# Patient Record
Sex: Male | Born: 1945 | Race: White | Hispanic: No | Marital: Married | Smoking: Never smoker
Health system: Southern US, Community
[De-identification: ages and names within clinical notes are randomized; demographics above are authoritative.]

## PROBLEM LIST (undated history)

## (undated) DIAGNOSIS — Z98811 Dental restoration status: Secondary | ICD-10-CM

## (undated) DIAGNOSIS — J45909 Unspecified asthma, uncomplicated: Secondary | ICD-10-CM

## (undated) DIAGNOSIS — K219 Gastro-esophageal reflux disease without esophagitis: Secondary | ICD-10-CM

## (undated) DIAGNOSIS — E7800 Pure hypercholesterolemia, unspecified: Secondary | ICD-10-CM

## (undated) DIAGNOSIS — H409 Unspecified glaucoma: Secondary | ICD-10-CM

## (undated) DIAGNOSIS — H269 Unspecified cataract: Secondary | ICD-10-CM

## (undated) DIAGNOSIS — J3081 Allergic rhinitis due to animal (cat) (dog) hair and dander: Secondary | ICD-10-CM

## (undated) DIAGNOSIS — M6530 Trigger finger, unspecified finger: Secondary | ICD-10-CM

## (undated) DIAGNOSIS — M109 Gout, unspecified: Secondary | ICD-10-CM

## (undated) DIAGNOSIS — E119 Type 2 diabetes mellitus without complications: Secondary | ICD-10-CM

## (undated) HISTORY — PX: GUM SURGERY: SHX658

## (undated) HISTORY — PX: RETINAL TEAR REPAIR CRYOTHERAPY: SHX5304

---

## 2012-08-10 DIAGNOSIS — M6530 Trigger finger, unspecified finger: Secondary | ICD-10-CM

## 2012-08-10 HISTORY — DX: Trigger finger, unspecified finger: M65.30

## 2012-09-01 ENCOUNTER — Other Ambulatory Visit: Payer: Self-pay | Admitting: Orthopedic Surgery

## 2012-09-01 ENCOUNTER — Encounter (HOSPITAL_BASED_OUTPATIENT_CLINIC_OR_DEPARTMENT_OTHER): Payer: Self-pay | Admitting: *Deleted

## 2012-09-01 NOTE — Pre-Procedure Instructions (Signed)
To come for BMET and EKG 

## 2012-09-05 ENCOUNTER — Encounter (HOSPITAL_BASED_OUTPATIENT_CLINIC_OR_DEPARTMENT_OTHER)
Admission: RE | Admit: 2012-09-05 | Discharge: 2012-09-05 | Disposition: A | Discharge status: Home / Self Care | Payer: Medicare Other | Source: Ambulatory Visit | Attending: Orthopedic Surgery | Admitting: Orthopedic Surgery

## 2012-09-05 ENCOUNTER — Other Ambulatory Visit: Payer: Self-pay

## 2012-09-05 LAB — BASIC METABOLIC PANEL
BUN: 23 mg/dL (ref 6–23)
Calcium: 9.4 mg/dL (ref 8.4–10.5)
Creatinine, Ser: 0.87 mg/dL (ref 0.50–1.35)
GFR calc Af Amer: >90 mL/min (ref >90)
GFR calc non Af Amer: 87 mL/min — ABNORMAL LOW (ref >90)
Glucose, Bld: 202 mg/dL — ABNORMAL HIGH (ref 70–99)

## 2012-09-06 ENCOUNTER — Ambulatory Visit (HOSPITAL_BASED_OUTPATIENT_CLINIC_OR_DEPARTMENT_OTHER): Payer: Medicare Other | Admitting: Anesthesiology

## 2012-09-06 ENCOUNTER — Ambulatory Visit (HOSPITAL_BASED_OUTPATIENT_CLINIC_OR_DEPARTMENT_OTHER)
Admission: RE | Admit: 2012-09-06 | Discharge: 2012-09-06 | Disposition: A | Discharge status: Home / Self Care | Payer: Medicare Other | Source: Ambulatory Visit | Attending: Orthopedic Surgery | Admitting: Orthopedic Surgery

## 2012-09-06 ENCOUNTER — Encounter (HOSPITAL_BASED_OUTPATIENT_CLINIC_OR_DEPARTMENT_OTHER)
Admission: RE | Disposition: A | Discharge status: Home / Self Care | Payer: Self-pay | Source: Ambulatory Visit | Attending: Orthopedic Surgery

## 2012-09-06 ENCOUNTER — Encounter (HOSPITAL_BASED_OUTPATIENT_CLINIC_OR_DEPARTMENT_OTHER): Payer: Self-pay | Admitting: Anesthesiology

## 2012-09-06 ENCOUNTER — Encounter (HOSPITAL_BASED_OUTPATIENT_CLINIC_OR_DEPARTMENT_OTHER): Payer: Self-pay | Admitting: Orthopedic Surgery

## 2012-09-06 DIAGNOSIS — M109 Gout, unspecified: Secondary | ICD-10-CM | POA: Insufficient documentation

## 2012-09-06 DIAGNOSIS — E119 Type 2 diabetes mellitus without complications: Secondary | ICD-10-CM | POA: Insufficient documentation

## 2012-09-06 DIAGNOSIS — G709 Myoneural disorder, unspecified: Secondary | ICD-10-CM | POA: Insufficient documentation

## 2012-09-06 DIAGNOSIS — E7800 Pure hypercholesterolemia, unspecified: Secondary | ICD-10-CM | POA: Insufficient documentation

## 2012-09-06 DIAGNOSIS — I6529 Occlusion and stenosis of unspecified carotid artery: Secondary | ICD-10-CM | POA: Insufficient documentation

## 2012-09-06 DIAGNOSIS — Z9109 Other allergy status, other than to drugs and biological substances: Secondary | ICD-10-CM | POA: Insufficient documentation

## 2012-09-06 DIAGNOSIS — M6530 Trigger finger, unspecified finger: Secondary | ICD-10-CM | POA: Insufficient documentation

## 2012-09-06 DIAGNOSIS — H409 Unspecified glaucoma: Secondary | ICD-10-CM | POA: Insufficient documentation

## 2012-09-06 DIAGNOSIS — H269 Unspecified cataract: Secondary | ICD-10-CM | POA: Insufficient documentation

## 2012-09-06 DIAGNOSIS — Z886 Allergy status to analgesic agent status: Secondary | ICD-10-CM | POA: Insufficient documentation

## 2012-09-06 DIAGNOSIS — J45909 Unspecified asthma, uncomplicated: Secondary | ICD-10-CM | POA: Insufficient documentation

## 2012-09-06 HISTORY — DX: Unspecified cataract: H26.9

## 2012-09-06 HISTORY — DX: Gout, unspecified: M10.9

## 2012-09-06 HISTORY — DX: Dental restoration status: Z98.811

## 2012-09-06 HISTORY — DX: Type 2 diabetes mellitus without complications: E11.9

## 2012-09-06 HISTORY — DX: Gastro-esophageal reflux disease without esophagitis: K21.9

## 2012-09-06 HISTORY — DX: Unspecified glaucoma: H40.9

## 2012-09-06 HISTORY — DX: Pure hypercholesterolemia, unspecified: E78.00

## 2012-09-06 HISTORY — DX: Trigger finger, unspecified finger: M65.30

## 2012-09-06 HISTORY — PX: TRIGGER FINGER RELEASE: SHX641

## 2012-09-06 HISTORY — DX: Unspecified asthma, uncomplicated: J45.909

## 2012-09-06 HISTORY — DX: Allergic rhinitis due to animal (cat) (dog) hair and dander: J30.81

## 2012-09-06 LAB — GLUCOSE, CAPILLARY: Glucose-Capillary: 124 mg/dL — ABNORMAL HIGH (ref 70–99)

## 2012-09-06 LAB — POCT HEMOGLOBIN-HEMACUE: Hemoglobin: 12 g/dL — ABNORMAL LOW (ref 13.0–17.0)

## 2012-09-06 SURGERY — RELEASE, A1 PULLEY, FOR TRIGGER FINGER
Anesthesia: Regional | Site: Hand | Laterality: Right | Wound class: Clean

## 2012-09-06 MED ORDER — HYDROCODONE-ACETAMINOPHEN 5-325 MG PO TABS: 1.0000 | ORAL_TABLET | Freq: Four times a day (QID) | ORAL | Status: DC | PRN

## 2012-09-06 MED ORDER — MIDAZOLAM HCL 2 MG/ML PO SYRP: 12.0000 mg | ORAL_SOLUTION | Freq: Once | ORAL | Status: DC | PRN

## 2012-09-06 MED ORDER — LIDOCAINE HCL (PF) 0.5 % IJ SOLN
INTRAMUSCULAR | Status: DC | PRN
  Administered 2012-09-06: 30 mL via INTRAVENOUS

## 2012-09-06 MED ORDER — FENTANYL CITRATE 0.05 MG/ML IJ SOLN: 50.0000 ug | INTRAMUSCULAR | Status: DC | PRN

## 2012-09-06 MED ORDER — MIDAZOLAM HCL 5 MG/5ML IJ SOLN
INTRAMUSCULAR | Status: DC | PRN
  Administered 2012-09-06: 1 mg via INTRAVENOUS

## 2012-09-06 MED ORDER — OXYCODONE HCL 5 MG PO TABS: 5.0000 mg | ORAL_TABLET | Freq: Once | ORAL | Status: DC | PRN

## 2012-09-06 MED ORDER — CHLORHEXIDINE GLUCONATE 4 % EX LIQD: 60.0000 mL | Freq: Once | CUTANEOUS | Status: DC

## 2012-09-06 MED ORDER — LACTATED RINGERS IV SOLN
INTRAVENOUS | Status: DC
  Administered 2012-09-06: 09:00:00 via INTRAVENOUS

## 2012-09-06 MED ORDER — OXYCODONE HCL 5 MG/5ML PO SOLN: 5.0000 mg | Freq: Once | ORAL | Status: DC | PRN

## 2012-09-06 MED ORDER — MIDAZOLAM HCL 2 MG/2ML IJ SOLN: 1.0000 mg | INTRAMUSCULAR | Status: DC | PRN

## 2012-09-06 MED ORDER — HYDROMORPHONE HCL PF 1 MG/ML IJ SOLN: 0.2500 mg | INTRAMUSCULAR | Status: DC | PRN

## 2012-09-06 MED ORDER — PROPOFOL INFUSION 10 MG/ML OPTIME
INTRAVENOUS | Status: DC | PRN
  Administered 2012-09-06: 75 ug/kg/min via INTRAVENOUS

## 2012-09-06 MED ORDER — ONDANSETRON HCL 4 MG/2ML IJ SOLN
INTRAMUSCULAR | Status: DC | PRN
  Administered 2012-09-06: 4 mg via INTRAVENOUS

## 2012-09-06 MED ORDER — CEFAZOLIN SODIUM-DEXTROSE 2-3 GM-% IV SOLR
2.0000 g | INTRAVENOUS | Status: AC
  Administered 2012-09-06: 2 g via INTRAVENOUS

## 2012-09-06 MED ORDER — BUPIVACAINE HCL (PF) 0.25 % IJ SOLN
INTRAMUSCULAR | Status: DC | PRN
  Administered 2012-09-06: 10 mL

## 2012-09-06 MED ORDER — FENTANYL CITRATE 0.05 MG/ML IJ SOLN
INTRAMUSCULAR | Status: DC | PRN
  Administered 2012-09-06: 50 ug via INTRAVENOUS

## 2012-09-06 MED ORDER — LIDOCAINE HCL (CARDIAC) 20 MG/ML IV SOLN
INTRAVENOUS | Status: DC | PRN
  Administered 2012-09-06: 30 mg via INTRAVENOUS

## 2012-09-06 SURGICAL SUPPLY — 33 items
BANDAGE COBAN STERILE 2 (GAUZE/BANDAGES/DRESSINGS) ×4 IMPLANT
BLADE SURG 15 STRL LF DISP TIS (BLADE) ×2 IMPLANT
BLADE SURG 15 STRL SS (BLADE) ×2
BNDG ESMARK 4X9 LF (GAUZE/BANDAGES/DRESSINGS) IMPLANT
CHLORAPREP W/TINT 26ML (MISCELLANEOUS) ×6 IMPLANT
CLOTH BEACON ORANGE TIMEOUT ST (SAFETY) ×2 IMPLANT
CORDS BIPOLAR (ELECTRODE) IMPLANT
COVER MAYO STAND STRL (DRAPES) ×6 IMPLANT
COVER TABLE BACK 60X90 (DRAPES) ×4 IMPLANT
CUFF TOURNIQUET SINGLE 18IN (TOURNIQUET CUFF) ×2 IMPLANT
DECANTER SPIKE VIAL GLASS SM (MISCELLANEOUS) IMPLANT
DRAPE EXTREMITY T 121X128X90 (DRAPE) ×4 IMPLANT
DRAPE SURG 17X23 STRL (DRAPES) ×2 IMPLANT
GAUZE XEROFORM 1X8 LF (GAUZE/BANDAGES/DRESSINGS) ×4 IMPLANT
GLOVE BIO SURGEON STRL SZ 6.5 (GLOVE) ×4 IMPLANT
GLOVE BIOGEL PI IND STRL 8.5 (GLOVE) ×2 IMPLANT
GLOVE BIOGEL PI INDICATOR 8.5 (GLOVE) ×2
GLOVE SURG ORTHO 8.0 STRL STRW (GLOVE) ×4 IMPLANT
GOWN BRE IMP PREV XXLGXLNG (GOWN DISPOSABLE) ×2 IMPLANT
GOWN PREVENTION PLUS XLARGE (GOWN DISPOSABLE) ×4 IMPLANT
NEEDLE 27GAX1X1/2 (NEEDLE) ×4 IMPLANT
NS IRRIG 1000ML POUR BTL (IV SOLUTION) ×2 IMPLANT
PACK BASIN DAY SURGERY FS (CUSTOM PROCEDURE TRAY) ×4 IMPLANT
PADDING CAST ABS 4INX4YD NS (CAST SUPPLIES) ×1
PADDING CAST ABS COTTON 4X4 ST (CAST SUPPLIES) ×1 IMPLANT
SHEET MEDIUM DRAPE 40X70 STRL (DRAPES) ×2 IMPLANT
SPONGE GAUZE 4X4 12PLY (GAUZE/BANDAGES/DRESSINGS) ×4 IMPLANT
STOCKINETTE 4X48 STRL (DRAPES) ×4 IMPLANT
SUT VICRYL RAPIDE 4/0 PS 2 (SUTURE) ×4 IMPLANT
SYR BULB 3OZ (MISCELLANEOUS) ×4 IMPLANT
SYR CONTROL 10ML LL (SYRINGE) ×4 IMPLANT
TOWEL OR 17X24 6PK STRL BLUE (TOWEL DISPOSABLE) ×4 IMPLANT
UNDERPAD 30X30 INCONTINENT (UNDERPADS AND DIAPERS) ×4 IMPLANT

## 2012-09-06 NOTE — H&P (Signed)
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 This has been going on for approximately 15 years. He is complaining of some stiffness. He states 15 years ago he had these injected by a rheumatologist while in [place]. This did not give him relief. He has subsequently been diagnosed with diabetes. He also has a history of gout. There is no history of thyroid problems, or arthritis. He has no history of injury to the hands or neck. He describes a mild to moderate aching type pain with weakness and stiffness.  He feels it is getting worse. Flexion causes increased symptoms for him. He has been using Voltaren gel without significant relief.He has had two injections. He continues to have triggering of his middle finger, ring and little on his right hand.    Past Medical History: He is allergic to aspirin. He is on Allopurinol, Glimipride, Simvastatin, Tramadol, and Lumigan. He has had a retinal repair.    Family Medical History: Positive for diabetes, heart disease, and high BP.  Social History: He does not smoke. He drinks socially. He is married and retired.  Review of Systems: Positive for glasses, asthma, otherwise negative for 14 points.  Brett Atkins is an 67 y.o. male.   Chief Complaint: STS RT middle, ring and small fingers HPI: see above  Past Medical History  Diagnosis Date  . GERD (gastroesophageal reflux disease)   . Gout   . Asthma     as a child - rare episodes now  . Allergy to cats   . Glaucoma   . Cataract, immature   . Dental crown present   . 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 Diabetes mellitus type 2, noninsulin dependent   . 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 Gum surgery    . Retinal tear repair cryotherapy Left     History  reviewed. No pertinent family history. Social History:  reports that he has never smoked. He has never used smokeless tobacco. He reports that  drinks alcohol. He reports that he does not use illicit drugs.  Allergies:  Allergies  Allergen Reactions  . Aspirin Swelling    SWELLING OF THROAT    No prescriptions prior to admission    Results for orders placed during the hospital encounter of 09/06/12 (from the past 48 hour(s))  BASIC METABOLIC PANEL     Status: Abnormal   Collection Time    09/05/12 12:15 PM      Result Value Range   Sodium 138  135 - 145 mEq/L   Potassium 4.4  3.5 - 5.1 mEq/L   Chloride 103  96 - 112 mEq/L   CO2 23  19 - 32 mEq/L   Glucose, Bld 202 (*) 70 - 99 mg/dL   BUN 23  6 - 23 mg/dL   Creatinine, Ser 1.61  0.50 - 1.35 mg/dL   Calcium 9.4  8.4 - 09.6 mg/dL   GFR calc non Af Amer 87 (*) >90 mL/min   GFR calc Af Amer >90  >90 mL/min   Comment:            The eGFR has been calculated     using the CKD EPI equation.     This calculation has not been  validated in all clinical     situations.     eGFR's persistently     <90 mL/min signify     possible Chronic Kidney Disease.    No results found.   Pertinent items are noted in HPI.  Height 5' 7.5" (1.715 m), weight 78.926 kg (174 lb).  General appearance: alert, cooperative and appears stated age Head: Normocephalic, without obvious abnormality Neck: no JVD Resp: clear to auscultation bilaterally Cardio: regular rate and rhythm, S1, S2 normal, no murmur, click, rub or gallop GI: soft, non-tender; bowel sounds normal; no masses,  no organomegaly Extremities: extremities normal, atraumatic, no cyanosis or edema Pulses: 2+ and symmetric Skin: Skin color, texture, turgor normal. No rashes or lesions Neurologic: Grossly normal Incision/Wound: na  Assessment/Plan He is desirous of proceeding to have this surgically released.  We have discussed release of the A-1 pulley right middle, ring and  little fingers.  This can be done as an outpatient under regional anesthesia.  He and his wife are aware there is no guarantee with the surgery, possibility of infection, recurrence, injury to arteries, nerves, tendons, incomplete relief of symptoms and dystrophy.     This will be scheduled as an outpatient under regional anesthesia for release A-1 pulley right middle, ring and small fingers.  Muneeb Veras R 09/06/2012, 7:42 AM

## 2012-09-06 NOTE — Anesthesia Postprocedure Evaluation (Signed)
  Anesthesia Post-op Note  Patient: Engineer, agricultural  Procedure(s) Performed: Procedure(s): RELEASE TRIGGER FINGER/A-1 PULLEY RIGHT MIDDLE, RING AND SMALL FINGER (Right)  Patient Location: PACU  Anesthesia Type:MAC and Bier block  Level of Consciousness: awake  Airway and Oxygen Therapy: Patient Spontanous Breathing  Post-op Pain: mild  Post-op Assessment: Post-op Vital signs reviewed, Patient's Cardiovascular Status Stable, Respiratory Function Stable, Patent Airway, No signs of Nausea or vomiting and Pain level controlled  Post-op Vital Signs: stable  Complications: No apparent anesthesia complications

## 2012-09-06 NOTE — Op Note (Signed)
Dictation Number [id number]

## 2012-09-06 NOTE — Transfer of Care (Signed)
Immediate Anesthesia Transfer of Care Note  Patient: Brett Atkins  Procedure(s) Performed: Procedure(s): RELEASE TRIGGER FINGER/A-1 PULLEY RIGHT MIDDLE, RING AND SMALL FINGER (Right)  Patient Location: PACU  Anesthesia Type:Bier block  Level of Consciousness: awake, alert , oriented and patient cooperative  Airway & Oxygen Therapy: Patient Spontanous Breathing and Patient connected to face mask oxygen  Post-op Assessment: Report given to PACU RN and Post -op Vital signs reviewed and stable  Post vital signs: Reviewed and stable  Complications: No apparent anesthesia complications

## 2012-09-06 NOTE — Anesthesia Procedure Notes (Addendum)
Procedure Name: MAC Date/Time: 09/06/2012 9:55 AM Performed by: Gyasi Hazzard D Pre-anesthesia Checklist: Patient identified, Emergency Drugs available, Suction available and Patient being monitored Patient Re-evaluated:Patient Re-evaluated prior to inductionOxygen Delivery Method: Simple face mask   Anesthesia Regional Block:  Bier block (IV Regional)  Pre-Anesthetic Checklist: ,, timeout performed, Correct Patient, Correct Site, Correct Laterality, Correct Procedure,, site marked, surgical consent,, at surgeon's request Needles:  Injection technique: Single-shot  Needle Type: Other      Needle Gauge: 20 and 20 G    Additional Needles: Bier block (IV Regional) Narrative:   Performed by: Personally   Bier block (IV Regional)

## 2012-09-06 NOTE — Brief Op Note (Signed)
09/06/2012  10:36 AM  PATIENT:  Brett Atkins  67 y.o. male  PRE-OPERATIVE DIAGNOSIS:  STS RIGHT MIDDLE, RING AND SMALL FINGER  POST-OPERATIVE DIAGNOSIS:  Stenosing Tenosynovitis Right Middle, Ring, and Small Finger  PROCEDURE:  Procedure(s): RELEASE TRIGGER FINGER/A-1 PULLEY RIGHT MIDDLE, RING AND SMALL FINGER (Right)  SURGEON:  Surgeon(s) and Role:    * Nicki Reaper, MD - Primary  PHYSICIAN ASSISTANT:   ASSISTANTS: none   ANESTHESIA:   local and regional  EBL:  Total I/O In: 800 [I.V.:800] Out: -   BLOOD ADMINISTERED:none  DRAINS: none   LOCAL MEDICATIONS USED:  MARCAINE     SPECIMEN:  No Specimen  DISPOSITION OF SPECIMEN:  N/A  COUNTS:  YES  TOURNIQUET:   Total Tourniquet Time Documented: Forearm (Right) - 41 minutes Total: Forearm (Right) - 41 minutes   DICTATION: .Other Dictation: Dictation Number [id number]  PLAN OF CARE: Discharge to home after PACU  PATIENT DISPOSITION:  PACU - hemodynamically stable.

## 2012-09-06 NOTE — Anesthesia Preprocedure Evaluation (Signed)
Anesthesia Evaluation  Patient identified by MRN, date of birth, ID band Patient awake    Reviewed: Allergy & Precautions, H&P , NPO status , Patient's Chart, lab work & pertinent test results  History of Anesthesia Complications (+) DIFFICULT AIRWAY  Airway Mallampati: I TM Distance: >3 FB Neck ROM: Full    Dental   Pulmonary asthma ,  breath sounds clear to auscultation        Cardiovascular Rhythm:Regular Rate:Normal     Neuro/Psych  Neuromuscular disease    GI/Hepatic GERD-  ,  Endo/Other  diabetes  Renal/GU      Musculoskeletal   Abdominal   Peds  Hematology   Anesthesia Other Findings   Reproductive/Obstetrics                           Anesthesia Physical Anesthesia Plan  ASA: III  Anesthesia Plan: MAC and Bier Block   Post-op Pain Management:    Induction: Intravenous  Airway Management Planned: Nasal Cannula  Additional Equipment:   Intra-op Plan:   Post-operative Plan:   Informed Consent: I have reviewed the patients History and Physical, chart, labs and discussed the procedure including the risks, benefits and alternatives for the proposed anesthesia with the patient or authorized representative who has indicated his/her understanding and acceptance.     Plan Discussed with: CRNA and Surgeon  Anesthesia Plan Comments:         Anesthesia Quick Evaluation

## 2012-09-07 ENCOUNTER — Encounter (HOSPITAL_BASED_OUTPATIENT_CLINIC_OR_DEPARTMENT_OTHER): Payer: Self-pay | Admitting: Orthopedic Surgery

## 2012-09-07 NOTE — Op Note (Signed)
NAME:  ZYLAN, ALMQUIST NO.:  [id number]  MEDICAL RECORD NO.:  [id number]  LOCATION:                                 FACILITY:  PHYSICIAN:  Cindee Salt, M.D.       DATE OF BIRTH:  16-Apr-1945  DATE OF PROCEDURE:  09/06/2012 DATE OF DISCHARGE:  09/06/2012                              OPERATIVE REPORT   PREOPERATIVE DIAGNOSIS:  Stenosing tenosynovitis, right middle, ring, and small fingers.  POSTOPERATIVE DIAGNOSIS:  Stenosing tenosynovitis, right middle, ring, and small fingers.  OPERATION:  Release of A1 pulleys, right middle, ring, and small fingers.  SURGEON:  Cindee Salt, MD  ANESTHESIA:  Forearm-based IV regional with local infiltration.  ANESTHESIOLOGIST:  Bedelia Person, M.D.  HISTORY:  The patient is a 67 year old male with a history of triggering of his middle, ring, and little fingers right hand, which has not responded to conservative treatment.  He has elected to undergo surgical release of the A1 pulleys of each of those fingers.  Pre, peri, and postoperative course have been discussed along with risks and complications.  He is aware that there is no guarantee with the surgery; possibility of infection; recurrence of injury to arteries, nerves, tendons, incomplete relief of symptoms, dystrophy.  In the preoperative area, the patient was seen, the extremity was marked by both patient and surgeon.  Antibiotic given.  DESCRIPTION OF PROCEDURE:  The patient was brought to the operating room where a forearm-based IV regional anesthetic was carried out without difficulty.  He was prepped using ChloraPrep in the supine position with the right arm free.  Just prior to making the incision, a bug was noticed on the operative instrument table.  The instruments were replaced.  The patient was re-prepped, redraped.  A new system entirely set up.  The dry time was 3 minutes.  Time-out again taken.  Oblique incisions were then made over the middle, ring, and  little fingers of his right hand, carried down through subcutaneous tissue.  The middle finger was approached first.  The neurovascular bundles were identified both radially and ulnarly.  An incision made on the radial aspect of the A1 pulley, small incision made centrally in A2.  The partial tenosynovectomy was performed proximally.  No further lesions were identified.  The finger was placed through full range motion.  No further triggering was noted.  The incision on the ring finger was then deepened, again neurovascular structures were identified radially and ulnarly.  Retractors were placed to protect these.  An incision was made on the radial aspect of the A1 pulley.  A small incision was made centrally in A2.  Some abrasions of the superficialis tendon were noted. These were debrided, partial synovectomy performed proximally.  The finger was placed through a full range motion, no further triggering was noted.  The small finger was approached next.  The incision deepened, again the neurovascular structures identified radially and ulnarly with retractors placed to protect these.  An incision made on the radial aspect of the A1 pulley.  A small incision made centrally in A2, and again some abrasion of the superficialis was noted.  This was debrided along with a partial synovectomy proximally.  Full range of motion without triggering was noted.  The wounds were copiously irrigated with saline.  The skin was then closed with interrupted 4-0 Vicryl Rapide.  A sterile compressive dressing was applied with the fingers free after an injection to the area of incision with 0.25% Marcaine without epinephrine, 8 mL was used.  On deflation of the tourniquet, all fingers immediately pinked.  He was taken to the recovery room for observation in satisfactory condition. He will be discharged home to return in 1 week on Norco.          ______________________________ Cindee Salt,  M.D.     GK/MEDQ  D:  09/06/2012  T:  09/07/2012  Job:  [id number]

## 2014-04-11 ENCOUNTER — Emergency Department (INDEPENDENT_AMBULATORY_CARE_PROVIDER_SITE_OTHER)
Admission: EM | Admit: 2014-04-11 | Discharge: 2014-04-11 | Disposition: A | Discharge status: Home / Self Care | Payer: Medicare Other | Source: Home / Self Care | Attending: Emergency Medicine | Admitting: Emergency Medicine

## 2014-04-11 ENCOUNTER — Encounter (HOSPITAL_COMMUNITY): Payer: Self-pay | Admitting: Emergency Medicine

## 2014-04-11 DIAGNOSIS — S81031A Puncture wound without foreign body, right knee, initial encounter: Secondary | ICD-10-CM

## 2014-04-11 MED ORDER — TETANUS-DIPHTH-ACELL PERTUSSIS 5-2.5-18.5 LF-MCG/0.5 IM SUSP
0.5000 mL | Freq: Once | INTRAMUSCULAR | Status: AC
  Administered 2014-04-11: 0.5 mL via INTRAMUSCULAR

## 2014-04-11 MED ORDER — CEPHALEXIN 500 MG PO CAPS: ORAL_CAPSULE | ORAL | Status: DC

## 2014-04-11 MED ORDER — TETANUS-DIPHTH-ACELL PERTUSSIS 5-2.5-18.5 LF-MCG/0.5 IM SUSP
INTRAMUSCULAR | Status: AC
  Filled 2014-04-11: qty 0.5

## 2014-04-11 NOTE — ED Notes (Signed)
Nail puncture wound to right knee.  Site unremarkable.  Incident occurred today.

## 2014-04-11 NOTE — Discharge Instructions (Signed)
Puncture Wound °A puncture wound is an injury that extends through all layers of the skin and into the tissue beneath the skin (subcutaneous tissue). Puncture wounds become infected easily because germs often enter the body and go beneath the skin during the injury. Having a deep wound with a small entrance point makes it difficult for your caregiver to adequately clean the wound. This is especially true if you have stepped on a nail and it has passed through a dirty shoe or other situations where the wound is obviously contaminated. °CAUSES  °Many puncture wounds involve glass, nails, splinters, fish hooks, or other objects that enter the skin (foreign bodies). A puncture wound may also be caused by a human bite or animal bite. °DIAGNOSIS  °A puncture wound is usually diagnosed by your history and a physical exam. You may need to have an X-ray or an ultrasound to check for any foreign bodies still in the wound. °TREATMENT  °· Your caregiver will clean the wound as thoroughly as possible. Depending on the location of the wound, a bandage (dressing) may be applied. °· Your caregiver might prescribe antibiotic medicines. °· You may need a follow-up visit to check on your wound. Follow all instructions as directed by your caregiver. °HOME CARE INSTRUCTIONS  °· Change your dressing once per day, or as directed by your caregiver. If the dressing sticks, it may be removed by soaking the area in water. °· If your caregiver has given you follow-up instructions, it is very important that you return for a follow-up appointment. Not following up as directed could result in a chronic or permanent injury, pain, and disability. °· Only take over-the-counter or prescription medicines for pain, discomfort, or fever as directed by your caregiver. °· If you are given antibiotics, take them as directed. Finish them even if you start to feel better. °You may need a tetanus shot if: °· You cannot remember when you had your last tetanus  shot. °· You have never had a tetanus shot. °If you got a tetanus shot, your arm may swell, get red, and feel warm to the touch. This is common and not a problem. If you need a tetanus shot and you choose not to have one, there is a rare chance of getting tetanus. Sickness from tetanus can be serious. °You may need a rabies shot if an animal bite caused your puncture wound. °SEEK MEDICAL CARE IF:  °· You have redness, swelling, or increasing pain in the wound. °· You have red streaks going away from the wound. °· You notice a bad smell coming from the wound or dressing. °· You have yellowish-white fluid (pus) coming from the wound. °· You are treated with an antibiotic for infection, but the infection is not getting better. °· You notice something in the wound, such as rubber from your shoe, cloth, or another object. °· You have a fever. °· You have severe pain. °· You have difficulty breathing. °· You feel dizzy or faint. °· You cannot stop vomiting. °· You lose feeling, develop numbness, or cannot move a limb below the wound. °· Your symptoms worsen. °MAKE SURE YOU: °· Understand these instructions. °· Will watch your condition. °· Will get help right away if you are not doing well or get worse. °Document Released: 01/06/2005 Document Revised: 06/21/2011 Document Reviewed: 09/15/2010 °[employer]® Patient Information ©2015 [employer]. This information is not intended to replace advice given to you by your health care provider. Make sure you discuss any questions you   have with your health care provider. ° °

## 2014-04-11 NOTE — ED Provider Notes (Signed)
CSN: [id number]     Arrival date & time 04/11/14  1812 History   First MD Initiated Contact with Patient 04/11/14 1824     Chief Complaint  Patient presents with  . Puncture Wound   (Consider location/radiation/quality/duration/timing/severity/associated sxs/prior Treatment) HPI Comments: 68 year old generally healthy male presents with a superficial puncture wound to the right knee. He states that he bent over and his right knee was punctured by a nail. He states that it was very superficial puncture wound and a very small nail. He denies having pain, stiffness or soreness. He states his right knee is asymptomatic. He is here primarily to have a tetanus vaccine update. It is been over 10 years since his last one.   Past Medical History  Diagnosis Date  . GERD (gastroesophageal reflux disease)   . Gout   . Asthma     as a child - rare episodes now  . Allergy to cats   . Glaucoma   . Cataract, immature   . Dental crown present   . 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 Diabetes mellitus type 2, noninsulin dependent   . 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 Gum surgery    . Retinal tear repair cryotherapy Left   . Trigger finger release Right 09/06/2012    Procedure: RELEASE TRIGGER FINGER/A-1 PULLEY RIGHT MIDDLE, RING AND SMALL FINGER;  Surgeon: Nicki ReaperGary R Kuzma, MD;  Location: [facility];  Service: Orthopedics;  Laterality: Right;   No family history on file. History  Substance Use Topics  . Smoking status: Never Smoker   . Smokeless tobacco: Never Used  . Alcohol Use: Yes     Comment: daily wine with dinner - 2 glasses    Review of Systems  All other systems reviewed and are negative.   Allergies  Aspirin  Home Medications   Prior to Admission medications   Medication Sig Start Date End Date Taking? Authorizing Provider   albuterol (PROVENTIL HFA;VENTOLIN HFA) 108 (90 BASE) MCG/ACT inhaler Inhale 2 puffs into the lungs every 6 (six) hours as needed for wheezing.    Historical Provider, MD  allopurinol (ZYLOPRIM) 300 MG tablet Take 300 mg by mouth daily.    Historical Provider, MD  bimatoprost (LUMIGAN) 0.03 % ophthalmic solution Place 1 drop into both eyes at bedtime.    Historical Provider, MD  famotidine (PEPCID) 20 MG tablet Take 20 mg by mouth 2 (two) times daily.    Historical Provider, MD  glimepiride (AMARYL) 4 MG tablet Take 4 mg by mouth daily before breakfast.    Historical Provider, MD  HYDROcodone-acetaminophen (NORCO) 5-325 MG per tablet Take 1 tablet by mouth every 6 (six) hours as needed for pain. 09/06/12   Cindee SaltGary Kuzma, MD  lisinopril (PRINIVIL,ZESTRIL) 5 MG tablet Take 5 mg by mouth daily.    Historical Provider, MD  metFORMIN (GLUCOPHAGE) 500 MG tablet Take 500 mg by mouth 2 (two) times daily with a meal.    Historical Provider, MD  simvastatin (ZOCOR) 40 MG tablet Take 40 mg by mouth every evening.    Historical Provider, MD  timolol (BETIMOL) 0.5 % ophthalmic solution Place 1 drop into both eyes daily.    Historical Provider, MD   BP 141/82 mmHg  Pulse 67  Temp(Src) 98.2 F (36.8 C) (Oral)  Resp 16  SpO2 97% Physical Exam  Constitutional:  He is oriented to person, place, and time. He appears well-developed and well-nourished. No distress.  Musculoskeletal:  Right knee with a very small less than 1 mm mark indicating an entrance wound from the nail. It is located over the medial joint space. There is no bleeding, bruising, redness, swelling or other evidence of injury. Patient demonstrates full extension and flexion. No changes in gait or weightbearing.  Neurological: He is alert and oriented to person, place, and time.  Skin: Skin is warm and dry.  Psychiatric: He has a normal mood and affect.  Nursing note and vitals reviewed.   ED Course  Procedures (including critical care  time) Labs Review Labs Reviewed - No data to display  Imaging Review No results found.   MDM   1. Puncture wound of knee without foreign body, right, initial encounter    Warm compresses Watch for infection, pain, redness, swelling, decrease function and seek medical attn promptly Tdap .5cc IM Keflex 1 gm now and repeat in 12h.    Hayden Rasmussenavid Bessye Stith, NP 04/11/14 1841  Hayden Rasmussenavid Broughton Eppinger, NP 04/11/14 [id number]

## 2014-07-04 ENCOUNTER — Emergency Department (HOSPITAL_COMMUNITY): Payer: Medicare Other

## 2014-07-04 ENCOUNTER — Encounter (HOSPITAL_COMMUNITY): Payer: Self-pay

## 2014-07-04 ENCOUNTER — Emergency Department (HOSPITAL_COMMUNITY)
Admission: EM | Admit: 2014-07-04 | Discharge: 2014-07-04 | Disposition: A | Discharge status: Home / Self Care | Payer: Medicare Other | Attending: Emergency Medicine | Admitting: Emergency Medicine

## 2014-07-04 DIAGNOSIS — H409 Unspecified glaucoma: Secondary | ICD-10-CM | POA: Insufficient documentation

## 2014-07-04 DIAGNOSIS — E780 Pure hypercholesterolemia: Secondary | ICD-10-CM | POA: Diagnosis not present

## 2014-07-04 DIAGNOSIS — M109 Gout, unspecified: Secondary | ICD-10-CM | POA: Insufficient documentation

## 2014-07-04 DIAGNOSIS — J45909 Unspecified asthma, uncomplicated: Secondary | ICD-10-CM | POA: Diagnosis not present

## 2014-07-04 DIAGNOSIS — Z98811 Dental restoration status: Secondary | ICD-10-CM | POA: Insufficient documentation

## 2014-07-04 DIAGNOSIS — J069 Acute upper respiratory infection, unspecified: Secondary | ICD-10-CM | POA: Diagnosis not present

## 2014-07-04 DIAGNOSIS — Z79899 Other long term (current) drug therapy: Secondary | ICD-10-CM | POA: Insufficient documentation

## 2014-07-04 DIAGNOSIS — R509 Fever, unspecified: Secondary | ICD-10-CM | POA: Diagnosis present

## 2014-07-04 DIAGNOSIS — K219 Gastro-esophageal reflux disease without esophagitis: Secondary | ICD-10-CM | POA: Insufficient documentation

## 2014-07-04 DIAGNOSIS — H269 Unspecified cataract: Secondary | ICD-10-CM | POA: Insufficient documentation

## 2014-07-04 DIAGNOSIS — E119 Type 2 diabetes mellitus without complications: Secondary | ICD-10-CM | POA: Diagnosis not present

## 2014-07-04 LAB — CBC WITH DIFFERENTIAL/PLATELET
Basophils Absolute: 0 10*3/uL (ref 0.0–0.1)
Basophils Relative: 0 % (ref 0–1)
EOS PCT: 1 % (ref 0–5)
Eosinophils Absolute: 0.1 10*3/uL (ref 0.0–0.7)
HCT: 41 % (ref 39.0–52.0)
HEMOGLOBIN: 13.5 g/dL (ref 13.0–17.0)
LYMPHS ABS: 1 10*3/uL (ref 0.7–4.0)
LYMPHS PCT: 10 % — AB (ref 12–46)
MCH: 31.5 pg (ref 26.0–34.0)
MCHC: 32.9 g/dL (ref 30.0–36.0)
MCV: 95.6 fL (ref 78.0–100.0)
MONOS PCT: 9 % (ref 3–12)
Monocytes Absolute: 0.8 10*3/uL (ref 0.1–1.0)
Neutro Abs: 7.4 10*3/uL (ref 1.7–7.7)
Neutrophils Relative %: 80 % — ABNORMAL HIGH (ref 43–77)
Platelets: 189 10*3/uL (ref 150–400)
RBC: 4.29 MIL/uL (ref 4.22–5.81)
RDW: 13.5 % (ref 11.5–15.5)
WBC: 9.3 10*3/uL (ref 4.0–10.5)

## 2014-07-04 LAB — BASIC METABOLIC PANEL
ANION GAP: 10 (ref 5–15)
BUN: 20 mg/dL (ref 6–23)
CALCIUM: 9 mg/dL (ref 8.4–10.5)
CO2: 27 mmol/L (ref 19–32)
CREATININE: 1.01 mg/dL (ref 0.50–1.35)
Chloride: 101 mmol/L (ref 96–112)
GFR calc Af Amer: 85 mL/min — ABNORMAL LOW (ref >90)
GFR, EST NON AFRICAN AMERICAN: 73 mL/min — AB (ref >90)
GLUCOSE: 130 mg/dL — AB (ref 70–99)
Potassium: 4 mmol/L (ref 3.5–5.1)
Sodium: 138 mmol/L (ref 135–145)

## 2014-07-04 LAB — I-STAT TROPONIN, ED: Troponin i, poc: 0.01 ng/mL (ref 0.00–0.08)

## 2014-07-04 LAB — BRAIN NATRIURETIC PEPTIDE: B NATRIURETIC PEPTIDE 5: 25 pg/mL (ref 0.0–100.0)

## 2014-07-04 MED ORDER — PSEUDOEPHEDRINE HCL 60 MG PO TABS: 60.0000 mg | ORAL_TABLET | ORAL | Status: DC | PRN

## 2014-07-04 MED ORDER — SODIUM CHLORIDE 0.9 % IV BOLUS (SEPSIS)
1000.0000 mL | Freq: Once | INTRAVENOUS | Status: AC
  Administered 2014-07-04: 1000 mL via INTRAVENOUS

## 2014-07-04 MED ORDER — ACETAMINOPHEN 325 MG PO TABS
650.0000 mg | ORAL_TABLET | Freq: Once | ORAL | Status: AC
  Administered 2014-07-04: 650 mg via ORAL
  Filled 2014-07-04: qty 2

## 2014-07-04 NOTE — Discharge Instructions (Signed)
Upper Respiratory Infection, Adult °An upper respiratory infection (URI) is also sometimes known as the common cold. The upper respiratory tract includes the nose, sinuses, throat, trachea, and bronchi. Bronchi are the airways leading to the lungs. Most people improve within 1 week, but symptoms can last up to 2 weeks. A residual cough may last even longer.  °CAUSES °Many different viruses can infect the tissues lining the upper respiratory tract. The tissues become irritated and inflamed and often become very moist. Mucus production is also common. A cold is contagious. You can easily spread the virus to others by oral contact. This includes kissing, sharing a glass, coughing, or sneezing. Touching your mouth or nose and then touching a surface, which is then touched by another person, can also spread the virus. °SYMPTOMS  °Symptoms typically develop 1 to 3 days after you come in contact with a cold virus. Symptoms vary from person to person. They may include: °· Runny nose. °· Sneezing. °· Nasal congestion. °· Sinus irritation. °· Sore throat. °· Loss of voice (laryngitis). °· Cough. °· Fatigue. °· Muscle aches. °· Loss of appetite. °· Headache. °· Low-grade fever. °DIAGNOSIS  °You might diagnose your own cold based on familiar symptoms, since most people get a cold 2 to 3 times a year. Your caregiver can confirm this based on your exam. Most importantly, your caregiver can check that your symptoms are not due to another disease such as strep throat, sinusitis, pneumonia, asthma, or epiglottitis. Blood tests, throat tests, and X-rays are not necessary to diagnose a common cold, but they may sometimes be helpful in excluding other more serious diseases. Your caregiver will decide if any further tests are required. °RISKS AND COMPLICATIONS  °You may be at risk for a more severe case of the common cold if you smoke cigarettes, have chronic heart disease (such as heart failure) or lung disease (such as asthma), or if  you have a weakened immune system. The very young and very old are also at risk for more serious infections. Bacterial sinusitis, middle ear infections, and bacterial pneumonia can complicate the common cold. The common cold can worsen asthma and chronic obstructive pulmonary disease (COPD). Sometimes, these complications can require emergency medical care and may be life-threatening. °PREVENTION  °The best way to protect against getting a cold is to practice good hygiene. Avoid oral or hand contact with people with cold symptoms. Wash your hands often if contact occurs. There is no clear evidence that vitamin C, vitamin E, echinacea, or exercise reduces the chance of developing a cold. However, it is always recommended to get plenty of rest and practice good nutrition. °TREATMENT  °Treatment is directed at relieving symptoms. There is no cure. Antibiotics are not effective, because the infection is caused by a virus, not by bacteria. Treatment may include: °· Increased fluid intake. Sports drinks offer valuable electrolytes, sugars, and fluids. °· Breathing heated mist or steam (vaporizer or shower). °· Eating chicken soup or other clear broths, and maintaining good nutrition. °· Getting plenty of rest. °· Using gargles or lozenges for comfort. °· Controlling fevers with ibuprofen or acetaminophen as directed by your caregiver. °· Increasing usage of your inhaler if you have asthma. °Zinc gel and zinc lozenges, taken in the first 24 hours of the common cold, can shorten the duration and lessen the severity of symptoms. Pain medicines may help with fever, muscle aches, and throat pain. A variety of non-prescription medicines are available to treat congestion and runny nose. Your caregiver   can make recommendations and may suggest nasal or lung inhalers for other symptoms.  HOME CARE INSTRUCTIONS   Only take over-the-counter or prescription medicines for pain, discomfort, or fever as directed by your  caregiver.  Use a warm mist humidifier or inhale steam from a shower to increase air moisture. This may keep secretions moist and make it easier to breathe.  Drink enough water and fluids to keep your urine clear or pale yellow.  Rest as needed.  Return to work when your temperature has returned to normal or as your caregiver advises. You may need to stay home longer to avoid infecting others. You can also use a face mask and careful hand washing to prevent spread of the virus. SEEK MEDICAL CARE IF:   After the first few days, you feel you are getting worse rather than better.  You need your caregiver's advice about medicines to control symptoms.  You develop chills, worsening shortness of breath, or brown or red sputum. These may be signs of pneumonia.  You develop yellow or brown nasal discharge or pain in the face, especially when you bend forward. These may be signs of sinusitis.  You develop a fever, swollen neck glands, pain with swallowing, or white areas in the back of your throat. These may be signs of strep throat. SEEK IMMEDIATE MEDICAL CARE IF:   You have a fever.  You develop severe or persistent headache, ear pain, sinus pain, or chest pain.  You develop wheezing, a prolonged cough, cough up blood, or have a change in your usual mucus (if you have chronic lung disease).  You develop sore muscles or a stiff neck. Document Released: 09/22/2000 Document Revised: 06/21/2011 Document Reviewed: 07/04/2013 [facility] Patient Information 2015 [place] [place]. This information is not intended to replace advice given to you by your health care provider. Make sure you discuss any questions you have with your health care provider.  Your evaluation in the ED today did not show any emergent causes for your symptoms at this time. Your chest x-ray did not show any evidence of pneumonia. Your likely suffering from a viral upper respiratory infection. Please take your medications as  directed. It is important for you to continue drinking plenty of fluids, maintaining rest, taking Tylenol for your fever. You will need to follow-up with your primary care for further evaluation and management of your symptoms. Return to ED for new or worsening symptoms.

## 2014-07-04 NOTE — ED Notes (Addendum)
Patient reports he began having a productive cough and fever yesterday.  [facility], told to come to ED because they do not have flu tests there.  Tylenol at 1700.

## 2014-07-04 NOTE — ED Provider Notes (Signed)
CSN: [id number]     Arrival date & time 07/04/14  1845 History   First MD Initiated Contact with Patient 07/04/14 1948     Chief Complaint  Patient presents with  . Fever     (Consider location/radiation/quality/duration/timing/severity/associated sxs/prior Treatment) HPI Brett Atkins is a 69 y.o. male with a history of type 2 diabetes, glaucoma comes in for evaluation of like symptoms. Patient is accompanied by his wife who contributes to the history of present illness. Patient states for the past 2 days he has had increasing nasal congestion, productive cough, mild myalgias that he rates as a 2/10, and a fever at home of 102.6. He has only tried Tylenol to improve his symptoms, which improved his fever. He went to see an urgent care facility today where they referred him to the ED for further evaluation of flu and pulmonary embolus. Patient denies any shortness of breath, chest pain, swelling in his legs, hemoptysis or history of DVT. However last week he does report traveling in the car to [place], breaking the trip up into 2 days, 5 hours in the carper day.  Past Medical History  Diagnosis Date  . GERD (gastroesophageal reflux disease)   . Gout   . Asthma     as a child - rare episodes now  . Allergy to cats   . Glaucoma   . Cataract, immature   . Dental crown present   . 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 Diabetes mellitus type 2, noninsulin dependent   . 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 Gum surgery    . Retinal tear repair cryotherapy Left   . Trigger finger release Right 09/06/2012    Procedure: RELEASE TRIGGER FINGER/A-1 PULLEY RIGHT MIDDLE, RING AND SMALL FINGER;  Surgeon: Nicki Reaper, MD;  Location: [facility];  Service: Orthopedics;  Laterality: Right;   History reviewed. No pertinent family history. History   Substance Use Topics  . Smoking status: Never Smoker   . Smokeless tobacco: Never Used  . Alcohol Use: Yes     Comment: daily wine with dinner - 2 glasses    Review of Systems A 10 point review of systems was completed and was negative except for pertinent positives and negatives as mentioned in the history of present illness     Allergies  Aspirin  Home Medications   Prior to Admission medications   Medication Sig Start Date End Date Taking? Authorizing Provider  acetaminophen (TYLENOL) 500 MG tablet Take 500 mg by mouth every 6 (six) hours as needed for fever (fever).   Yes Historical Provider, MD  allopurinol (ZYLOPRIM) 300 MG tablet Take 300 mg by mouth daily.   Yes Historical Provider, MD  bimatoprost (LUMIGAN) 0.03 % ophthalmic solution Place 1 drop into both eyes at bedtime.   Yes Historical Provider, MD  famotidine (PEPCID) 20 MG tablet Take 20 mg by mouth 2 (two) times daily.   Yes Historical Provider, MD  glimepiride (AMARYL) 4 MG tablet Take 4 mg by mouth daily before breakfast.   Yes Historical Provider, MD  lisinopril (PRINIVIL,ZESTRIL) 5 MG tablet Take 5 mg by mouth daily.   Yes Historical Provider, MD  metFORMIN (GLUCOPHAGE) 500 MG tablet Take 500 mg by mouth 2 (two) times daily with a meal.   Yes Historical Provider, MD  simvastatin (ZOCOR) 40 MG tablet Take  40 mg by mouth daily.    Yes Historical Provider, MD  timolol (BETIMOL) 0.5 % ophthalmic solution Place 1 drop into both eyes at bedtime.    Yes Historical Provider, MD  albuterol (PROVENTIL HFA;VENTOLIN HFA) 108 (90 BASE) MCG/ACT inhaler Inhale 2 puffs into the lungs every 6 (six) hours as needed for wheezing.    Historical Provider, MD  cephALEXin (KEFLEX) 500 MG capsule Take 2 capsules now and 2 capsules in 12 hours. Patient not taking: Reported on 07/04/2014 04/11/14   Hayden Rasmussen, NP  HYDROcodone-acetaminophen (NORCO) 5-325 MG per tablet Take 1 tablet by mouth every 6 (six) hours as needed for pain. Patient  not taking: Reported on 07/04/2014 09/06/12   Cindee Salt, MD  pseudoephedrine (SUDAFED) 60 MG tablet Take 1 tablet (60 mg total) by mouth every 4 (four) hours as needed for congestion. 07/04/14   Joycie Peek, PA-C   BP 111/60 mmHg  Pulse 74  Temp(Src) 100.7 F (38.2 C) (Oral)  Resp 22  Ht  (1.727 m)  Wt 176 lb (79.833 kg)  BMI 26.77 kg/m2  SpO2 92% Physical Exam  Constitutional: He is oriented to person, place, and time. He appears well-developed and well-nourished.  HENT:  Head: Normocephalic and atraumatic.  Mouth/Throat: Oropharynx is clear and moist.  Eyes: Conjunctivae are normal. Pupils are equal, round, and reactive to light. Right eye exhibits no discharge. Left eye exhibits no discharge. No scleral icterus.  Neck: Normal range of motion. Neck supple. No JVD present.  No meningismus or nuchal rigidity  Cardiovascular: Normal rate, regular rhythm and normal heart sounds.   Pulmonary/Chest: Effort normal and breath sounds normal. No stridor. No respiratory distress. He has no wheezes. He has no rales.  Lungs clear to auscultation  Abdominal: Soft. There is no tenderness.  Musculoskeletal: Normal range of motion. He exhibits no edema or tenderness.  Neurological: He is alert and oriented to person, place, and time.  Cranial Nerves II-XII grossly intact  Skin: Skin is warm and dry. No rash noted.  Psychiatric: He has a normal mood and affect.  Nursing note and vitals reviewed.   ED Course  Procedures (including critical care time) Labs Review Labs Reviewed  BASIC METABOLIC PANEL - Abnormal; Notable for the following:    Glucose, Bld 130 (*)    GFR calc non Af Amer 73 (*)    GFR calc Af Amer 85 (*)    All other components within normal limits  CBC WITH DIFFERENTIAL/PLATELET - Abnormal; Notable for the following:    Neutrophils Relative % 80 (*)    Lymphocytes Relative 10 (*)    All other components within normal limits  BRAIN NATRIURETIC PEPTIDE  I-STAT  TROPOININ, ED    Imaging Review Dg Chest 2 View  07/04/2014   CLINICAL DATA:  Cough and fever  EXAM: CHEST  2 VIEW  COMPARISON:  None.  FINDINGS: The heart size and mediastinal contours are within normal limits. Both lungs are clear. The visualized skeletal structures are unremarkable.  IMPRESSION: No active cardiopulmonary disease.   Electronically Signed   By: Alcide Clever M.D.   On: 07/04/2014 20:39     EKG Interpretation   Date/Time:  Thursday July 04 2014 19:47:46 EDT Ventricular Rate:  78 PR Interval:  183 QRS Duration: 85 QT Interval:  348 QTC Calculation: 396 R Axis:   63 Text Interpretation:  Sinus rhythm Baseline wander in lead(s) V2 V6 Sinus  rhythm T wave abnormality Artifact Borderline ECG Confirmed by LOCKWOOD,  ROBERT  MD [id number]) on 07/04/2014 8:52:10 PM     Meds given in ED:  Medications  sodium chloride 0.9 % bolus 1,000 mL (0 mLs Intravenous Stopped 07/04/14 2304)  acetaminophen (TYLENOL) tablet 650 mg (650 mg Oral Given 07/04/14 2128)    Discharge Medication List as of 07/04/2014  9:49 PM    START taking these medications   Details  pseudoephedrine (SUDAFED) 60 MG tablet Take 1 tablet (60 mg total) by mouth every 4 (four) hours as needed for congestion., Starting 07/04/2014, Until Discontinued, Print       Filed Vitals:   07/04/14 2041 07/04/14 2042 07/04/14 2200 07/04/14 2300  BP:  110/61 111/56 111/60  Pulse:  85 81 74  Temp:  101.3 F (38.5 C) 100.7 F (38.2 C)   TempSrc:  Oral Oral   Resp: 16 18  22   Height:      Weight:      SpO2:  92% 93% 92%    MDM  Vitals stable - WNL -temp improving in ED with Tylenol. Pt resting comfortably in ED. PE--benign lung exam. No evidence of Resp Distress. O2 sat 95% during my exam. Physical exam grossly normal Labwork noncontributory. EKG reassuring, neg Trop, no chest pain Imaging: chest x-ray shows no acute cardio pulmonary pathology  Pt states he would like to go home now, requesting  discharge. DDX--patient likely suffering from viral URI. We will encourage increased fluids, rest and will give decongestants as patient does not have history of hypertension. Discussed further symptomatic care at home including Tylenol for fever. No evidence of Acute bacterial process  I discussed all relevant lab findings and imaging results with pt and they verbalized understanding. Discussed f/u with PCP within 48 hrs and return precautions, pt very amenable to plan. Prior to patient discharge, I discussed and reviewed this case with Dr.Lockwood, who also saw and evaluated the patient.   Final diagnoses:  URI (upper respiratory infection)      Joycie PeekBenjamin Keiosha Cancro, PA-C 07/05/14 1657  Gerhard Munchobert Lockwood, MD 07/07/14 2354

## 2014-07-04 NOTE — ED Notes (Signed)
Pt returned from xray

## 2014-07-04 NOTE — ED Notes (Signed)
Pt ambulating independently w/ steady gait on d/c in no acute distress, A&Ox4. D/c instructions reviewed w/ pt and family - pt and family deny any further questions or concerns at present. Rx given x1  

## 2016-11-14 ENCOUNTER — Encounter (HOSPITAL_COMMUNITY): Payer: Self-pay | Admitting: Emergency Medicine

## 2016-11-14 ENCOUNTER — Emergency Department (HOSPITAL_COMMUNITY)
Admission: EM | Admit: 2016-11-14 | Discharge: 2016-11-14 | Disposition: A | Discharge status: Home / Self Care | Payer: Medicare Other | Attending: Emergency Medicine | Admitting: Emergency Medicine

## 2016-11-14 DIAGNOSIS — M71122 Other infective bursitis, left elbow: Secondary | ICD-10-CM | POA: Diagnosis not present

## 2016-11-14 DIAGNOSIS — M6530 Trigger finger, unspecified finger: Secondary | ICD-10-CM | POA: Diagnosis not present

## 2016-11-14 DIAGNOSIS — Z7984 Long term (current) use of oral hypoglycemic drugs: Secondary | ICD-10-CM | POA: Diagnosis not present

## 2016-11-14 DIAGNOSIS — R2232 Localized swelling, mass and lump, left upper limb: Secondary | ICD-10-CM | POA: Diagnosis present

## 2016-11-14 DIAGNOSIS — E119 Type 2 diabetes mellitus without complications: Secondary | ICD-10-CM | POA: Diagnosis not present

## 2016-11-14 MED ORDER — SULFAMETHOXAZOLE-TRIMETHOPRIM 800-160 MG PO TABS: 1.0000 | ORAL_TABLET | Freq: Two times a day (BID) | ORAL | 0 refills | Status: DC

## 2016-11-14 NOTE — ED Provider Notes (Signed)
[facility]-EMERGENCY DEPT Provider Note   CSN: [id number] Arrival date & time: 11/14/16  1241  By signing my name below, I, Diona BrownerJennifer Gorman, attest that this documentation has been prepared under the direction and in the presence of SPX CorporationMichael Kelsy Polack, PA-C. Electronically Signed: Diona BrownerJennifer Gorman, ED Scribe. 11/14/16. 1:18 PM.  History   Chief Complaint Chief Complaint  Patient presents with  . Joint Swelling    L elbow    HPI Brett KeensJohn Atkins is a 71 y.o. male with a PMHx of DM2 and gout who presents to the Emergency Department complaining of worsening left elbow swelling that started ~ 8 days ago (11/06/16). Pt reports he was outside gardening when he bumped his arm. Per pt's wife, he had a long gash that they cleaned and put Band-Aids on. Over the last 3 days there has been an increased area of heat, redness and swelling on the tip of the patient elbow where he bumped his arm originally. He hasn't taken anything for pain. No worsening factors noted. Pt denies pain, fever, or any other sx at this time.   The history is provided by the patient and the spouse. No language interpreter was used.    Past Medical History:  Diagnosis Date  . Allergy to cats   . Asthma    as a child - rare episodes now  . Cataract, immature   . Dental crown present   . Diabetes mellitus type 2, noninsulin dependent (HCC)   . 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 GERD (gastroesophageal reflux disease)   . Glaucoma   . Gout   . Stenosing tenosynovitis of finger 08/2012   right middle, ring, small fingers    There are no active problems to display for this patient.   Past Surgical History:  Procedure Laterality Date  . GUM SURGERY    . RETINAL TEAR REPAIR CRYOTHERAPY Left   . TRIGGER FINGER RELEASE Right 09/06/2012   Procedure: RELEASE TRIGGER FINGER/A-1 PULLEY RIGHT MIDDLE, RING AND SMALL FINGER;  Surgeon: Nicki ReaperGary R Kuzma, MD;  Location: [facility];  Service:  Orthopedics;  Laterality: Right;       Home Medications    Prior to Admission medications   Medication Sig Start Date End Date Taking? Authorizing Provider  acetaminophen (TYLENOL) 500 MG tablet Take 500 mg by mouth every 6 (six) hours as needed for fever (fever).    [provider]  albuterol (PROVENTIL HFA;VENTOLIN HFA) 108 (90 BASE) MCG/ACT inhaler Inhale 2 puffs into the lungs every 6 (six) hours as needed for wheezing.    [provider]  allopurinol (ZYLOPRIM) 300 MG tablet Take 300 mg by mouth daily.    [provider]  bimatoprost (LUMIGAN) 0.03 % ophthalmic solution Place 1 drop into both eyes at bedtime.    [provider]  cephALEXin (KEFLEX) 500 MG capsule Take 2 capsules now and 2 capsules in 12 hours. Patient not taking: Reported on 07/04/2014 04/11/14   Hayden RasmussenMabe, David, NP  famotidine (PEPCID) 20 MG tablet Take 20 mg by mouth 2 (two) times daily.    [provider]  glimepiride (AMARYL) 4 MG tablet Take 4 mg by mouth daily before breakfast.    [provider]  HYDROcodone-acetaminophen (NORCO) 5-325 MG per tablet Take 1 tablet by mouth every 6 (six) hours as needed for pain. Patient not taking: Reported on 07/04/2014 09/06/12   [name], Gary, MD  lisinopril (PRINIVIL,ZESTRIL) 5 MG tablet Take 5 mg by mouth  daily.    [provider]  metFORMIN (GLUCOPHAGE) 500 MG tablet Take 500 mg by mouth 2 (two) times daily with a meal.    [provider]  pseudoephedrine (SUDAFED) 60 MG tablet Take 1 tablet (60 mg total) by mouth every 4 (four) hours as needed for congestion. 07/04/14   Cartner, Sharlet Salina, PA-C  simvastatin (ZOCOR) 40 MG tablet Take 40 mg by mouth daily.     [provider]  sulfamethoxazole-trimethoprim (BACTRIM DS,SEPTRA DS) 800-160 MG tablet Take 1 tablet by mouth 2 (two) times daily. 11/14/16   [name], [name], PA-C  timolol (BETIMOL) 0.5 % ophthalmic solution Place 1 drop into both eyes at bedtime.      [provider]    Family History History reviewed. No pertinent family history.  Social History Social History  Substance Use Topics  . Smoking status: Never Smoker  . Smokeless tobacco: Never Used  . Alcohol use Yes     Comment: daily wine with dinner - 2 glasses     Allergies   Aspirin   Review of Systems Review of Systems  Constitutional: Negative for fever.  Musculoskeletal: Positive for joint swelling. Negative for arthralgias.  Skin: Positive for wound.     Physical Exam Updated Vital Signs BP 125/71 (BP Location: Right Arm)   Pulse 64   Temp 99 F (37.2 C) (Oral)   Resp 16   SpO2 98%   Physical Exam  Constitutional: He appears well-developed and well-nourished.  HENT:  Head: Normocephalic and atraumatic.  Right Ear: External ear normal.  Left Ear: External ear normal.  Eyes: Conjunctivae are normal. Right eye exhibits no discharge. Left eye exhibits no discharge. No scleral icterus.  Neck: Neck supple.  Cardiovascular:  Pulses:      Radial pulses are 2+ on the right side, and 2+ on the left side.  Pulmonary/Chest: Effort normal. No respiratory distress.  Musculoskeletal:       Left shoulder: Normal.       Left elbow: He exhibits normal range of motion. No tenderness found.       Left hand: He exhibits normal range of motion, no tenderness and normal capillary refill. Normal sensation noted. Normal strength noted.  Left elbow with localized area of heat and erythema to swollen olecranon bursa. Healing 1cm cut over the posterior elbow. Normal ROM of the left elbow to flexion and extension. Equal and appropriate strength for flexion and extension. No bony tenderess. Neurovascularly intact distally. Compartments soft above and below affected joint.  Noted tringer finger of left hand  Neurological: He is alert.  Skin: Skin is warm and dry. No pallor.  Psychiatric: He has a normal mood and affect.  Nursing note and vitals reviewed.    ED  Treatments / Results  DIAGNOSTIC STUDIES: Oxygen Saturation is 98% on RA, normal by my interpretation.   COORDINATION OF CARE: 1:18 PM-Discussed next steps with pt. Pt verbalized understanding and is agreeable with the plan.   Labs (all labs ordered are listed, but only abnormal results are displayed) Labs Reviewed - No data to display  EKG  EKG Interpretation None       Radiology No results found.  Procedures Procedures (including critical care time)  Medications Ordered in ED Medications - No data to display   Initial Impression / Assessment and Plan / ED Course  I have reviewed the triage vital signs and the nursing notes.  Pertinent labs & imaging results that were available during my care of  the patient were reviewed by me and considered in my medical decision making (see chart for details).     71 year old male presenting with left elbow redness and swelling. Patient is afebrile on presentation. On exam there is localized area of heat and erythema to left swollen olecranon bursa with healing 1cm cut over the posterior elbow. Presentation consistent with infection of the left olecranon bursa. Patient is with good rom of the elbow. No concern for septic joint at this time. Patient given antibiotics and told to follow up with ortho in the next week. I advised the patient to return to the emergency department with new or worsening symptoms or new concerns. Specific return precautions discussed. The patient verbalized understanding and agreement with plan. All questions answered. No further questions at this time. The patient appears safe for discharge.  Patient case discussed with Dr. Rhunette CroftNanavati who is in agreement with plan.   Final Clinical Impressions(s) / ED Diagnoses   Final diagnoses:  Infection of left olecranon bursa  Trigger finger of left hand, unspecified finger    New Prescriptions Discharge Medication List as of 11/14/2016  1:35 PM    START taking these  medications   Details  sulfamethoxazole-trimethoprim (BACTRIM DS,SEPTRA DS) 800-160 MG tablet Take 1 tablet by mouth 2 (two) times daily., Starting Sun 11/14/2016, Print       I personally performed the services described in this documentation, which was scribed in my presence. The recorded information has been reviewed and is accurate.       Jacinto HalimMaczis, Keliyah Spillman M, PA-C 11/14/16 1651    Derwood KaplanNanavati, Ankit, MD 11/15/16 [phone]

## 2016-11-14 NOTE — ED Notes (Signed)
Patient was alert, oriented and stable upon discharge. RN went over AVS and patient had no further questions.  

## 2016-11-14 NOTE — Discharge Instructions (Signed)
Please take all of your antibiotics until finished!   You may develop abdominal discomfort or diarrhea from the antibiotic.  You may help offset this with probiotics which you can buy or get in yogurt. Do not eat  or take the probiotics until 2 hours after your antibiotic.   Please follow up with orthopedics in 1 week. If you develop symptoms such as fever, inability to move the elbow please return sooner for re-evaluation. If you develop worsening or new concerning symptoms you can return to the emergency department for re-evaluation.

## 2016-11-14 NOTE — ED Triage Notes (Signed)
Pt injured L elbow several days ago while mowing, patient states elbow has become swollen and red. Pt denies pain.

## 2017-01-05 IMAGING — CR DG CHEST 2V
2 series · 2 of 2 positions shown · non-contrast
Comparison: None.

CLINICAL DATA: Cough and fever

EXAM:
CHEST  2 VIEW

[w chest pa]
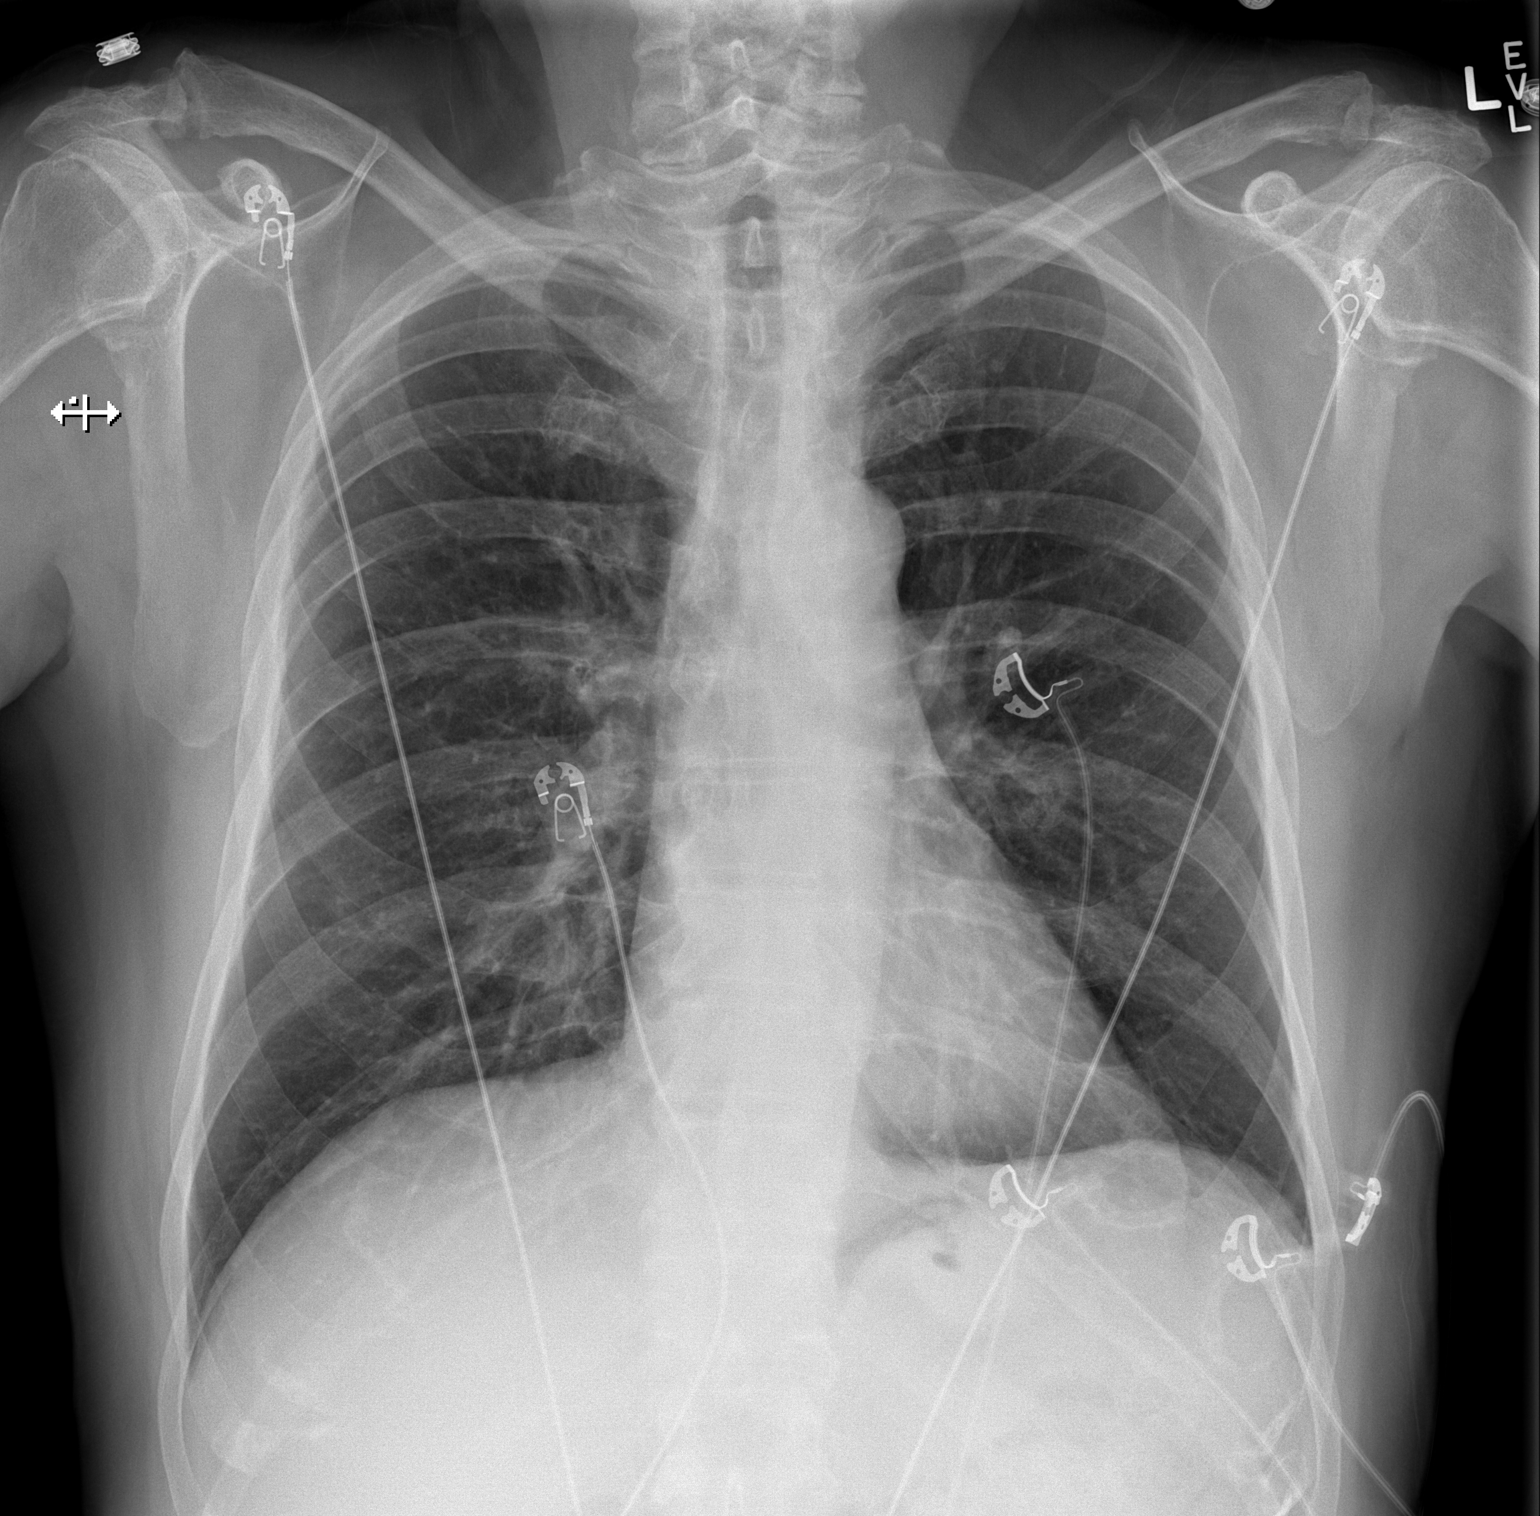

[w chest lat]
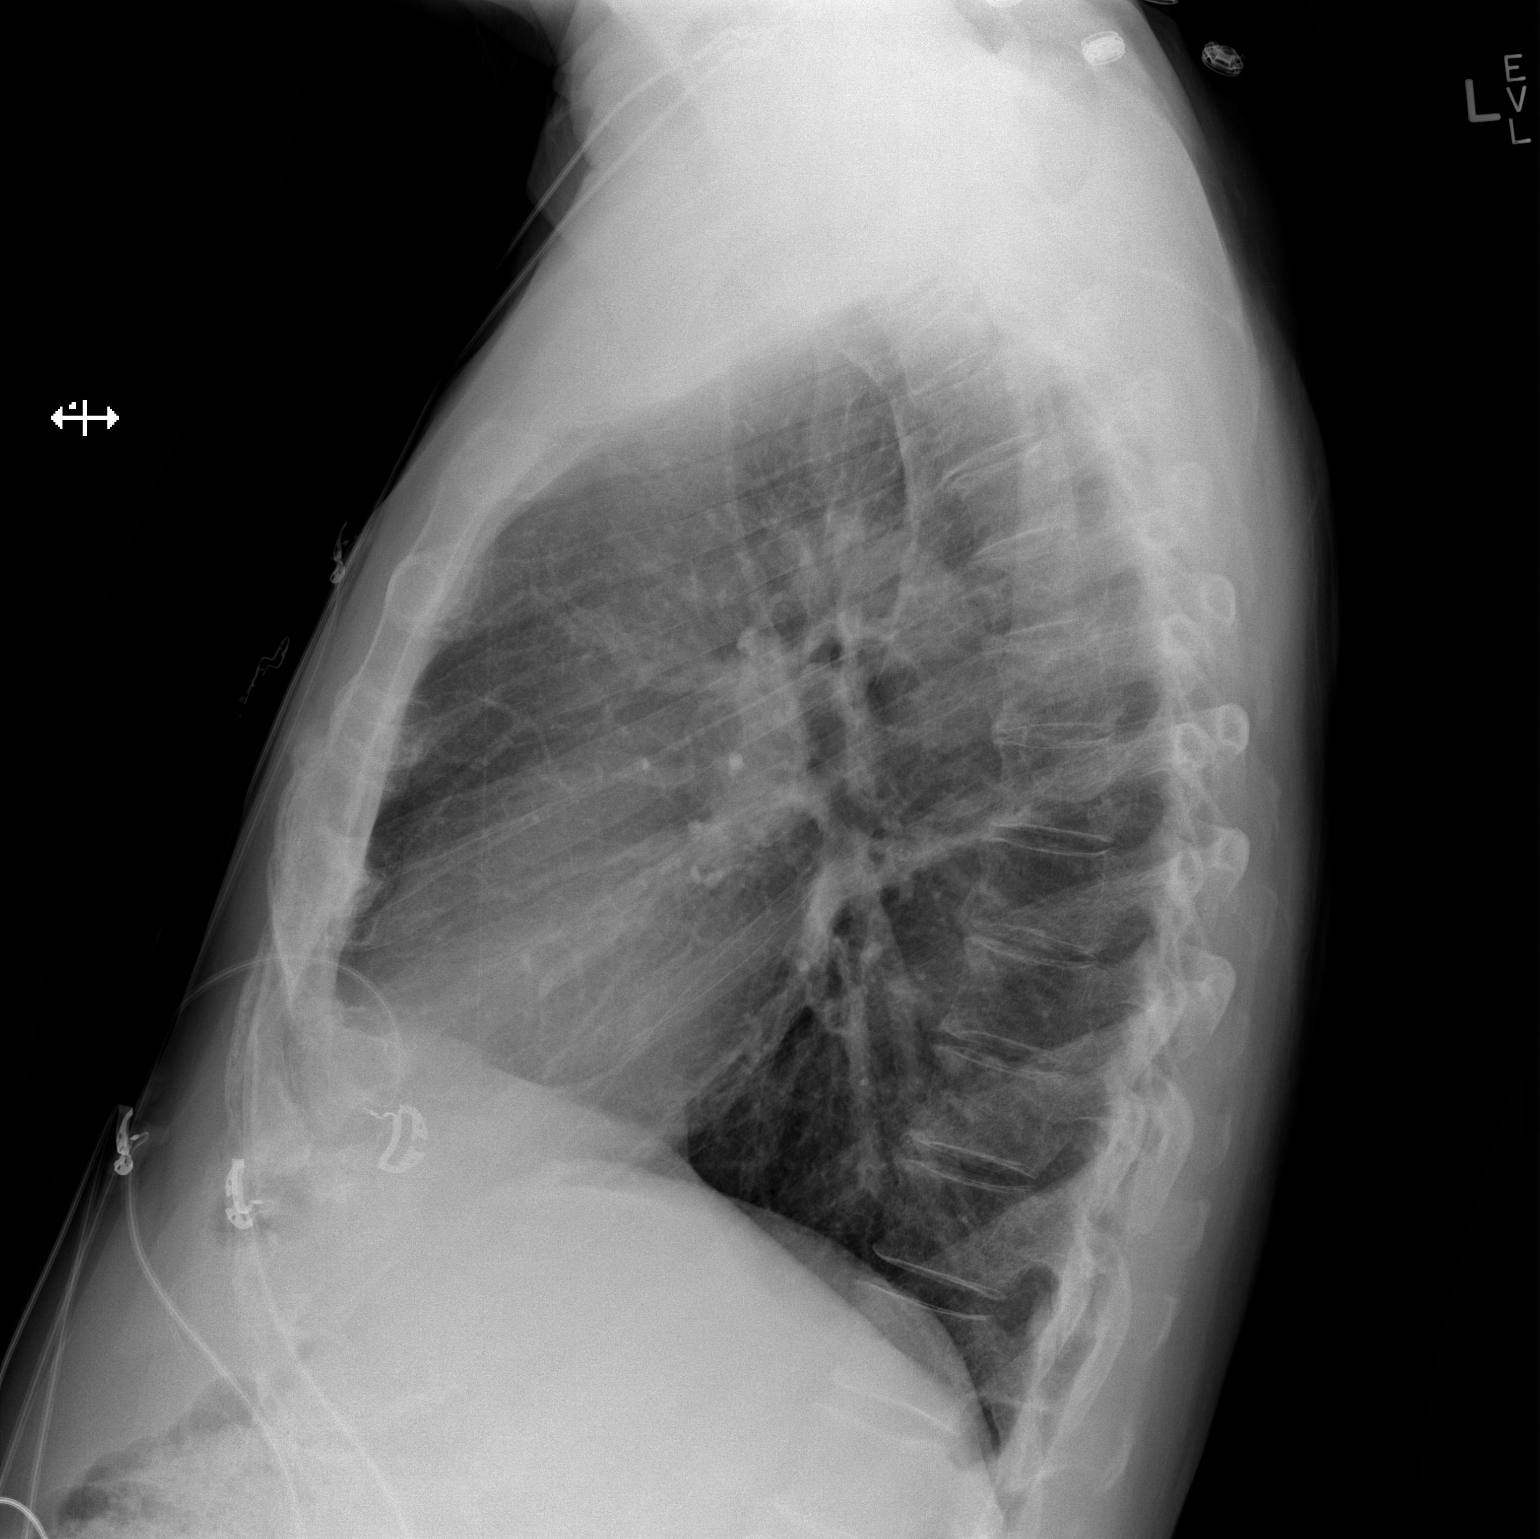

[2 of 2 positions shown; findings below may reference images not displayed]

FINDINGS: The heart size and mediastinal contours are within normal limits.
Both lungs are clear. The visualized skeletal structures are
unremarkable.
IMPRESSION: No active cardiopulmonary disease.

## 2018-03-16 ENCOUNTER — Other Ambulatory Visit: Payer: Self-pay | Admitting: Orthopedic Surgery

## 2018-03-21 ENCOUNTER — Other Ambulatory Visit: Payer: Self-pay

## 2018-03-21 ENCOUNTER — Encounter (HOSPITAL_BASED_OUTPATIENT_CLINIC_OR_DEPARTMENT_OTHER): Payer: Self-pay | Admitting: *Deleted

## 2018-03-22 ENCOUNTER — Encounter (HOSPITAL_BASED_OUTPATIENT_CLINIC_OR_DEPARTMENT_OTHER)
Admission: RE | Admit: 2018-03-22 | Discharge: 2018-03-22 | Disposition: A | Discharge status: Home / Self Care | Payer: Medicare Other | Source: Ambulatory Visit | Attending: Orthopedic Surgery | Admitting: Orthopedic Surgery

## 2018-03-22 ENCOUNTER — Other Ambulatory Visit: Payer: Self-pay

## 2018-03-22 DIAGNOSIS — Z01818 Encounter for other preprocedural examination: Secondary | ICD-10-CM | POA: Diagnosis not present

## 2018-03-22 LAB — BASIC METABOLIC PANEL
Anion gap: 8 (ref 5–15)
BUN: 20 mg/dL (ref 8–23)
CO2: 29 mmol/L (ref 22–32)
Calcium: 9.1 mg/dL (ref 8.9–10.3)
Chloride: 102 mmol/L (ref 98–111)
Creatinine, Ser: 0.93 mg/dL (ref 0.61–1.24)
GFR calc Af Amer: >60 mL/min (ref >60)
GFR calc non Af Amer: >60 mL/min (ref >60)
Glucose, Bld: 239 mg/dL — ABNORMAL HIGH (ref 70–99)
Potassium: 4.7 mmol/L (ref 3.5–5.1)
Sodium: 139 mmol/L (ref 135–145)

## 2018-03-30 ENCOUNTER — Encounter (HOSPITAL_BASED_OUTPATIENT_CLINIC_OR_DEPARTMENT_OTHER): Payer: Self-pay

## 2018-03-30 ENCOUNTER — Ambulatory Visit (HOSPITAL_BASED_OUTPATIENT_CLINIC_OR_DEPARTMENT_OTHER): Payer: Medicare Other | Admitting: Anesthesiology

## 2018-03-30 ENCOUNTER — Ambulatory Visit (HOSPITAL_BASED_OUTPATIENT_CLINIC_OR_DEPARTMENT_OTHER): Admit: 2018-03-30 | Payer: Self-pay | Admitting: Orthopedic Surgery

## 2018-03-30 ENCOUNTER — Other Ambulatory Visit: Payer: Self-pay

## 2018-03-30 ENCOUNTER — Encounter (HOSPITAL_BASED_OUTPATIENT_CLINIC_OR_DEPARTMENT_OTHER)
Admission: RE | Disposition: A | Discharge status: Home / Self Care | Payer: Self-pay | Source: Home / Self Care | Attending: Orthopedic Surgery

## 2018-03-30 ENCOUNTER — Ambulatory Visit (HOSPITAL_BASED_OUTPATIENT_CLINIC_OR_DEPARTMENT_OTHER)
Admission: RE | Admit: 2018-03-30 | Discharge: 2018-03-30 | Disposition: A | Discharge status: Home / Self Care | Payer: Medicare Other | Attending: Orthopedic Surgery | Admitting: Orthopedic Surgery

## 2018-03-30 DIAGNOSIS — K219 Gastro-esophageal reflux disease without esophagitis: Secondary | ICD-10-CM | POA: Insufficient documentation

## 2018-03-30 DIAGNOSIS — M65322 Trigger finger, left index finger: Secondary | ICD-10-CM | POA: Insufficient documentation

## 2018-03-30 DIAGNOSIS — H409 Unspecified glaucoma: Secondary | ICD-10-CM | POA: Insufficient documentation

## 2018-03-30 DIAGNOSIS — Z7984 Long term (current) use of oral hypoglycemic drugs: Secondary | ICD-10-CM | POA: Insufficient documentation

## 2018-03-30 DIAGNOSIS — Z79899 Other long term (current) drug therapy: Secondary | ICD-10-CM | POA: Insufficient documentation

## 2018-03-30 DIAGNOSIS — M65332 Trigger finger, left middle finger: Secondary | ICD-10-CM | POA: Insufficient documentation

## 2018-03-30 DIAGNOSIS — E7800 Pure hypercholesterolemia, unspecified: Secondary | ICD-10-CM | POA: Diagnosis not present

## 2018-03-30 DIAGNOSIS — J45909 Unspecified asthma, uncomplicated: Secondary | ICD-10-CM | POA: Diagnosis not present

## 2018-03-30 DIAGNOSIS — E119 Type 2 diabetes mellitus without complications: Secondary | ICD-10-CM | POA: Insufficient documentation

## 2018-03-30 DIAGNOSIS — M109 Gout, unspecified: Secondary | ICD-10-CM | POA: Insufficient documentation

## 2018-03-30 HISTORY — PX: TRIGGER FINGER RELEASE: SHX641

## 2018-03-30 LAB — GLUCOSE, CAPILLARY
Glucose-Capillary: 118 mg/dL — ABNORMAL HIGH (ref 70–99)
Glucose-Capillary: 130 mg/dL — ABNORMAL HIGH (ref 70–99)

## 2018-03-30 SURGERY — RELEASE, A1 PULLEY, FOR TRIGGER FINGER
Anesthesia: Monitor Anesthesia Care | Site: Finger | Laterality: Left

## 2018-03-30 SURGERY — RELEASE, A1 PULLEY, FOR TRIGGER FINGER
Anesthesia: Regional | Laterality: Left

## 2018-03-30 MED ORDER — FENTANYL CITRATE (PF) 100 MCG/2ML IJ SOLN
INTRAMUSCULAR | Status: AC
  Filled 2018-03-30: qty 2

## 2018-03-30 MED ORDER — LIDOCAINE HCL (PF) 0.5 % IJ SOLN
INTRAMUSCULAR | Status: DC | PRN
  Administered 2018-03-30: 30 mL via INTRAVENOUS

## 2018-03-30 MED ORDER — CHLORHEXIDINE GLUCONATE 4 % EX LIQD: 60.0000 mL | Freq: Once | CUTANEOUS | Status: DC

## 2018-03-30 MED ORDER — BUPIVACAINE HCL (PF) 0.25 % IJ SOLN
INTRAMUSCULAR | Status: DC | PRN
  Administered 2018-03-30: 8 mL

## 2018-03-30 MED ORDER — CEFAZOLIN SODIUM-DEXTROSE 2-4 GM/100ML-% IV SOLN
INTRAVENOUS | Status: AC
  Filled 2018-03-30: qty 100

## 2018-03-30 MED ORDER — MIDAZOLAM HCL 2 MG/2ML IJ SOLN
INTRAMUSCULAR | Status: AC
  Filled 2018-03-30: qty 2

## 2018-03-30 MED ORDER — FENTANYL CITRATE (PF) 100 MCG/2ML IJ SOLN
50.0000 ug | INTRAMUSCULAR | Status: DC | PRN
  Administered 2018-03-30: 50 ug via INTRAVENOUS

## 2018-03-30 MED ORDER — ONDANSETRON HCL 4 MG/2ML IJ SOLN
INTRAMUSCULAR | Status: DC | PRN
  Administered 2018-03-30: 4 mg via INTRAVENOUS

## 2018-03-30 MED ORDER — PROPOFOL 10 MG/ML IV BOLUS
INTRAVENOUS | Status: DC | PRN
  Administered 2018-03-30: 20 mg via INTRAVENOUS

## 2018-03-30 MED ORDER — CEFAZOLIN SODIUM-DEXTROSE 2-4 GM/100ML-% IV SOLN
2.0000 g | INTRAVENOUS | Status: AC
  Administered 2018-03-30: 2 g via INTRAVENOUS

## 2018-03-30 MED ORDER — MIDAZOLAM HCL 2 MG/2ML IJ SOLN
1.0000 mg | INTRAMUSCULAR | Status: DC | PRN
  Administered 2018-03-30: 1 mg via INTRAVENOUS

## 2018-03-30 MED ORDER — SCOPOLAMINE 1 MG/3DAYS TD PT72: 1.0000 | MEDICATED_PATCH | Freq: Once | TRANSDERMAL | Status: DC | PRN

## 2018-03-30 MED ORDER — TRAMADOL HCL 50 MG PO TABS: 50.0000 mg | ORAL_TABLET | Freq: Four times a day (QID) | ORAL | 0 refills | Status: AC | PRN

## 2018-03-30 MED ORDER — LACTATED RINGERS IV SOLN
INTRAVENOUS | Status: DC
  Administered 2018-03-30: 11:00:00 via INTRAVENOUS

## 2018-03-30 SURGICAL SUPPLY — 32 items
BANDAGE COBAN STERILE 2 (GAUZE/BANDAGES/DRESSINGS) ×3 IMPLANT
BLADE SURG 15 STRL LF DISP TIS (BLADE) ×1 IMPLANT
BLADE SURG 15 STRL SS (BLADE) ×2
BNDG ESMARK 4X9 LF (GAUZE/BANDAGES/DRESSINGS) IMPLANT
CHLORAPREP W/TINT 26ML (MISCELLANEOUS) ×3 IMPLANT
CORD BIPOLAR FORCEPS 12FT (ELECTRODE) ×3 IMPLANT
COVER BACK TABLE 60X90IN (DRAPES) ×3 IMPLANT
COVER MAYO STAND STRL (DRAPES) ×3 IMPLANT
COVER WAND RF STERILE (DRAPES) IMPLANT
CUFF TOURNIQUET SINGLE 18IN (TOURNIQUET CUFF) ×3 IMPLANT
DECANTER SPIKE VIAL GLASS SM (MISCELLANEOUS) IMPLANT
DRAPE EXTREMITY T 121X128X90 (DRAPE) ×3 IMPLANT
DRAPE SURG 17X23 STRL (DRAPES) ×3 IMPLANT
GAUZE SPONGE 4X4 12PLY STRL (GAUZE/BANDAGES/DRESSINGS) ×3 IMPLANT
GAUZE XEROFORM 1X8 LF (GAUZE/BANDAGES/DRESSINGS) ×3 IMPLANT
GLOVE BIO SURGEON STRL SZ 6.5 (GLOVE) ×2 IMPLANT
GLOVE BIO SURGEONS STRL SZ 6.5 (GLOVE) ×1
GLOVE BIOGEL PI IND STRL 8.5 (GLOVE) ×1 IMPLANT
GLOVE BIOGEL PI INDICATOR 8.5 (GLOVE) ×2
GLOVE SURG ORTHO 8.0 STRL STRW (GLOVE) ×3 IMPLANT
GOWN STRL REUS W/ TWL LRG LVL3 (GOWN DISPOSABLE) ×1 IMPLANT
GOWN STRL REUS W/TWL LRG LVL3 (GOWN DISPOSABLE) ×2
GOWN STRL REUS W/TWL XL LVL3 (GOWN DISPOSABLE) ×3 IMPLANT
NEEDLE PRECISIONGLIDE 27X1.5 (NEEDLE) ×3 IMPLANT
NS IRRIG 1000ML POUR BTL (IV SOLUTION) ×3 IMPLANT
PACK BASIN DAY SURGERY FS (CUSTOM PROCEDURE TRAY) ×3 IMPLANT
STOCKINETTE 4X48 STRL (DRAPES) ×3 IMPLANT
SUT ETHILON 4 0 PS 2 18 (SUTURE) ×3 IMPLANT
SYR BULB 3OZ (MISCELLANEOUS) ×3 IMPLANT
SYR CONTROL 10ML LL (SYRINGE) ×3 IMPLANT
TOWEL GREEN STERILE FF (TOWEL DISPOSABLE) ×6 IMPLANT
UNDERPAD 30X30 (UNDERPADS AND DIAPERS) ×3 IMPLANT

## 2018-03-30 NOTE — Brief Op Note (Signed)
03/30/2018  12:45 PM  PATIENT:  Brett Atkins  72 y.o. male  PRE-OPERATIVE DIAGNOSIS:  TRIGGER LEFT INDEX AND MIDDLE FINGER  POST-OPERATIVE DIAGNOSIS:  TRIGGER LEFT INDEX AND MIDDLE FINGER  PROCEDURE:  Procedure(s): RELEASE TRIGGER FINGER/A-1 PULLEY LEFT INDEX AND LEFT MIDDLE FINGER (Left)  SURGEON:  Surgeon(s) and Role:    * [name], Cendy Oconnor, MD - Primary  PHYSICIAN ASSISTANT:   ASSISTANTS: none   ANESTHESIA:   local, regional and IV sedation  EBL:  5 mL   BLOOD ADMINISTERED:none  DRAINS: none   LOCAL MEDICATIONS USED:  BUPIVICAINE   SPECIMEN:  No Specimen  DISPOSITION OF SPECIMEN:  N/A  COUNTS:  YES  TOURNIQUET:   Total Tourniquet Time Documented: Forearm (Left) - -5739 minutes Total: Forearm (Left) - -5739 minutes   DICTATION: .Dragon Dictation  PLAN OF CARE: Discharge to home after PACU  PATIENT DISPOSITION:  PACU - hemodynamically stable.

## 2018-03-30 NOTE — Progress Notes (Signed)
EKG and chart reviewed with Dr. Marcene Duosobert Fitzgerald. Proceed with surgery as scheduled

## 2018-03-30 NOTE — Op Note (Signed)
NAME: Brett Atkins MEDICAL RECORD NO: [id number] DATE OF BIRTH: 1946/02/16 [facility]: [facility] PHYSICIAN: Nicki ReaperGARY R. 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   ASSISTANT: none   ANESTHESIA:  Bier block with sedation and Local   INTRAVENOUS FLUIDS:  Per anesthesia flow sheet.   ESTIMATED BLOOD LOSS:  Minimal.   COMPLICATIONS:  None.   SPECIMENS:  none   TOURNIQUET TIME:    Total Tourniquet Time Documented: Forearm (Left) - -5739 minutes Total: Forearm (Left) - -5739 minutes    DISPOSITION:  Stable to PACU.   INDICATIONS: Patient is a 72 year old male with a history of triggering of his left index left middle fingers.  This not responded to conservative treatment.  He has had trigger fingers on the opposite hand requiring release.  Pre-peri-and postoperative course been discussed along with risks and complications.  He is aware that there is no guarantee to the surgery the possibility of infection recurrence injury to arteries nerves tendons complete relief symptoms and dystrophy.  In the preoperative area the patient is seen the extremity marked by both patient and surgeon antibiotic given  OPERATIVE COURSE: Patient is brought to the operating room where a forearm-based IV regional anesthetic was carried out without difficulty under the direction of the anesthesia department.  Was prepped using ChloraPrep in the supine position left arm free.  A three-minute dry time was allowed and timeout taken to confirm patient procedure.  An oblique incision was made over the A1 pulley of the index finger left hand carried down through subcutaneous tissue.  Bleeders were electrocauterized with bipolar.  The dissection  carried down to the A1 pulley.  Retractors were placed medially and ulnarly tracting neurovascular bundles in nature side.  The A1 pulley was then released on its radial aspect a small incision was made centrally and A2 the tenosynovial tissue proximally was separated the 2 tendons were separated breaking any adhesions between the tenosynovial tissue the proximal superficialis and profundus.  Finger placed through full range of motion no further triggering was noted.  A separate incision was then made on the middle finger A1 pulley again taken down through subcutaneous tissue. Retractors were placed radially and ulnarly protect neurovascular structures. A large cystic area was immediately encountered this was excised at the energy of interval between the A1 A2 pulley the A1 pulley was markedly thickened this was released on its radial aspect.  A small incision was made centrally and A2.  Was done after releasing the A1 pulley on its radial aspect.  Tendons were then separated by to break any adhesions tenosynovial tissue proximally was separated with blunt dissection.  The finger placed a full range of motion no further triggering was noted.  Each of the wounds was copious irrigated with saline closed interrupted 4 nylon sutures local infiltration quarter percent bupivacaine without epinephrine was given a total of 8 cc was used sterile compressive dressing with the fingers 3 was applied.  On deflation of the tourniquet all fingers immediately pink.  He was taken to the recovery room for observation in satisfactory condition.  He will be discharged home return to [facility] [place] in 1 week on Ultram.  Cindee SaltGary Madalene Mickler, MD Electronically signed, 03/30/18

## 2018-03-30 NOTE — H&P (Signed)
  Abdoulaye Rolene CourseLodge Ebarb is an 72 y.o. male.   Chief Complaint: Trigger fingers left index and middle AVW:UJWJHPI:Donne is a 72 year old right-hand-dominant male  with a new complaint of catching of his left index and middle fingers. This been going on for the past 2 or 3 months. He has had trigger fingers in the past release by myself. He has no history of injury. He is complaining only minimal discomfort. He states nothing makes it better or worse. It is worse in the a.m. and release and it resolves during the day with use. He complains of pain at the metacarpal phalangeal joint volar aspect of the left index middle fingers. Is only mild in nature. He has a history of diabetes and gout. He has no history of thyroid problems. Only history is positive diabetes heart disease high blood pressure.These have been injected on 2 occasions. He complains of no pain. States that he has a occasional feeling of catching of his middle finger. He is not complaining of any numbness or tingling.     Past Medical History:  Diagnosis Date  . Allergy to cats   . Asthma    as a child - rare episodes now  . Cataract, immature   . Dental crown present   . Diabetes mellitus type 2, noninsulin dependent (HCC)   . 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 GERD (gastroesophageal reflux disease)   . Glaucoma   . Gout   . 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 GUM SURGERY    . RETINAL TEAR REPAIR CRYOTHERAPY Left   . TRIGGER FINGER RELEASE Right 09/06/2012   Procedure: RELEASE TRIGGER FINGER/A-1 PULLEY RIGHT MIDDLE, RING AND SMALL FINGER;  Surgeon: Nicki ReaperGary R Ova Meegan, MD;  Location: [facility];  Service: Orthopedics;  Laterality: Right;    History reviewed. No pertinent family history. Social History:  reports that he has never smoked. He has never used smokeless tobacco. He reports current  alcohol use. He reports that he does not use drugs.  Allergies:  Allergies  Allergen Reactions  . Aspirin Swelling    SWELLING OF THROAT    No medications prior to admission.    No results found for this or any previous visit (from the past 48 hour(s)).  No results found.   Pertinent items are noted in HPI.  Height 5\' 8"  (1.727 m), weight 79.4 kg.  General appearance: alert, cooperative and appears stated age Head: Normocephalic, without obvious abnormality Neck: no JVD Resp: clear to auscultation bilaterally Cardio: regular rate and rhythm, S1, S2 normal, no murmur, click, rub or gallop GI: soft, non-tender; bowel sounds normal; no masses,  no organomegaly Extremities: Catching left index and middle fingers Pulses: 2+ and symmetric Skin: Skin color, texture, turgor normal. No rashes or lesions Neurologic: Grossly normal Incision/Wound: na  Assessment/Plan Assessment:  1. Trigger index finger of left hand  2. Trigger middle finger of left hand    Plan: He states it is not bothering him at all. We have advised that should this recur with this would require release. If it does not get any worse he may elect not have anything done to it. I will    Cindee SaltGary Talicia Sui 03/30/2018, 9:13 AM

## 2018-03-30 NOTE — Discharge Instructions (Addendum)

## 2018-03-30 NOTE — Anesthesia Preprocedure Evaluation (Signed)
Anesthesia Evaluation  Patient identified by MRN, date of birth, ID band Patient awake    Reviewed: Allergy & Precautions, NPO status , Patient's Chart, lab work & pertinent test results  Airway Mallampati: II  TM Distance: >3 FB Neck ROM: Full    Dental   Pulmonary asthma ,    breath sounds clear to auscultation       Cardiovascular hypertension, Pt. on medications  Rhythm:Regular Rate:Normal     Neuro/Psych negative neurological ROS     GI/Hepatic Neg liver ROS, GERD  ,  Endo/Other  diabetes, Type 2, Oral Hypoglycemic Agents  Renal/GU negative Renal ROS     Musculoskeletal   Abdominal   Peds  Hematology negative hematology ROS (+)   Anesthesia Other Findings   Reproductive/Obstetrics                             Anesthesia Physical Anesthesia Plan  ASA: II  Anesthesia Plan: MAC and Bier Block and Bier Block-LIDOCAINE ONLY   Post-op Pain Management:    Induction: Intravenous  PONV Risk Score and Plan: 1 and Propofol infusion, Ondansetron and Treatment may vary due to age or medical condition  Airway Management Planned: Natural Airway and Simple Face Mask  Additional Equipment:   Intra-op Plan:   Post-operative Plan:   Informed Consent: I have reviewed the patients History and Physical, chart, labs and discussed the procedure including the risks, benefits and alternatives for the proposed anesthesia with the patient or authorized representative who has indicated his/her understanding and acceptance.     Plan Discussed with: CRNA  Anesthesia Plan Comments:         Anesthesia Quick Evaluation

## 2018-03-30 NOTE — Anesthesia Postprocedure Evaluation (Signed)
Anesthesia Post Note  Patient: Brett Atkins  Procedure(s) Performed: RELEASE TRIGGER FINGER/A-1 PULLEY LEFT INDEX AND LEFT MIDDLE FINGER (Left Finger)     Patient location during evaluation: PACU Anesthesia Type: MAC and Bier Block Level of consciousness: awake and alert Pain management: pain level controlled Vital Signs Assessment: post-procedure vital signs reviewed and stable Respiratory status: spontaneous breathing, nonlabored ventilation, respiratory function stable and patient connected to nasal cannula oxygen Cardiovascular status: stable and blood pressure returned to baseline Postop Assessment: no apparent nausea or vomiting Anesthetic complications: no    Last Vitals:  Vitals:   03/30/18 1308 03/30/18 1309  BP: 117/69   Pulse: (!) 56 (!) 54  Resp: 15 11  Temp:    SpO2: 97% 98%    Last Pain:  Vitals:   03/30/18 1249  TempSrc:   PainSc: 0-No pain                 Tiajuana Amass

## 2018-03-30 NOTE — Transfer of Care (Signed)
Immediate Anesthesia Transfer of Care Note  Patient: Brett Atkins  Procedure(s) Performed: RELEASE TRIGGER FINGER/A-1 PULLEY LEFT INDEX AND LEFT MIDDLE FINGER (Left Finger)  Patient Location: PACU  Anesthesia Type:MAC and Bier block  Level of Consciousness: awake, alert  and oriented  Airway & Oxygen Therapy: Patient Spontanous Breathing and Patient connected to face mask oxygen  Post-op Assessment: Report given to RN and Post -op Vital signs reviewed and stable  Post vital signs: Reviewed and stable  Last Vitals:  Vitals Value Taken Time  BP    Temp    Pulse 60 03/30/2018 12:50 PM  Resp 11 03/30/2018 12:50 PM  SpO2 100 % 03/30/2018 12:50 PM  Vitals shown include unvalidated device data.  Last Pain:  Vitals:   03/30/18 1048  TempSrc: Oral  PainSc: 0-No pain         Complications: No apparent anesthesia complications

## 2018-03-31 ENCOUNTER — Encounter (HOSPITAL_BASED_OUTPATIENT_CLINIC_OR_DEPARTMENT_OTHER): Payer: Self-pay | Admitting: Orthopedic Surgery

## 2019-05-25 ENCOUNTER — Ambulatory Visit: Payer: Medicare Other | Attending: Internal Medicine

## 2019-05-25 DIAGNOSIS — Z23 Encounter for immunization: Secondary | ICD-10-CM | POA: Insufficient documentation

## 2019-05-25 NOTE — Progress Notes (Signed)
   Covid-19 Vaccination Clinic  Name:  Brett Atkins    MRN: [id number] DOB: Jul 18, 1945  05/25/2019  [name] was observed post Covid-19 immunization for 30 minutes based on pre-vaccination screening without incidence. He was provided with Vaccine Information Sheet and instruction to access the V-Safe system.   [name] was instructed to call 911 with any severe reactions post vaccine: Marland Kitchen Difficulty breathing  . Swelling of your face and throat  . A fast heartbeat  . A bad rash all over your body  . Dizziness and weakness    Immunizations Administered    Name Date Dose VIS Date Route   Pfizer COVID-19 Vaccine 05/25/2019  5:56 PM 0.3 mL 03/23/2019 Intramuscular   Manufacturer: [employer], [employer]   Lot: [id number]   NDC: [address]-5

## 2019-06-17 ENCOUNTER — Ambulatory Visit: Payer: Medicare Other | Attending: Internal Medicine

## 2019-06-17 DIAGNOSIS — Z23 Encounter for immunization: Secondary | ICD-10-CM | POA: Insufficient documentation

## 2019-06-17 NOTE — Progress Notes (Signed)
   Covid-19 Vaccination Clinic  Name:  Brett Atkins    MRN: [id number] DOB: Jun 08, 1945  06/17/2019  Brett Atkins was observed post Covid-19 immunization for 30 minutes based on pre-vaccination screening without incident. He was provided with Vaccine Information Sheet and instruction to access the V-Safe system.   Brett Atkins was instructed to call 911 with any severe reactions post vaccine: Marland Kitchen Difficulty breathing  . Swelling of face and throat  . A fast heartbeat  . A bad rash all over body  . Dizziness and weakness   Immunizations Administered    Name Date Dose VIS Date Route   Pfizer COVID-19 Vaccine 06/17/2019  8:36 AM 0.3 mL 03/23/2019 Intramuscular   Manufacturer: [employer], [employer]   Lot: [id number]   NDC: [address]-4

## 2020-10-29 ENCOUNTER — Emergency Department (HOSPITAL_COMMUNITY)
Admission: EM | Admit: 2020-10-29 | Discharge: 2020-10-29 | Disposition: A | Discharge status: Home / Self Care | Payer: Medicare Other | Attending: Emergency Medicine | Admitting: Emergency Medicine

## 2020-10-29 ENCOUNTER — Encounter (HOSPITAL_COMMUNITY): Payer: Self-pay

## 2020-10-29 DIAGNOSIS — J45909 Unspecified asthma, uncomplicated: Secondary | ICD-10-CM | POA: Insufficient documentation

## 2020-10-29 DIAGNOSIS — L299 Pruritus, unspecified: Secondary | ICD-10-CM | POA: Diagnosis present

## 2020-10-29 DIAGNOSIS — T782XXA Anaphylactic shock, unspecified, initial encounter: Secondary | ICD-10-CM | POA: Diagnosis not present

## 2020-10-29 DIAGNOSIS — Z7984 Long term (current) use of oral hypoglycemic drugs: Secondary | ICD-10-CM | POA: Diagnosis not present

## 2020-10-29 DIAGNOSIS — T63441A Toxic effect of venom of bees, accidental (unintentional), initial encounter: Secondary | ICD-10-CM | POA: Insufficient documentation

## 2020-10-29 DIAGNOSIS — E119 Type 2 diabetes mellitus without complications: Secondary | ICD-10-CM | POA: Insufficient documentation

## 2020-10-29 DIAGNOSIS — Z7951 Long term (current) use of inhaled steroids: Secondary | ICD-10-CM | POA: Diagnosis not present

## 2020-10-29 LAB — CBC WITH DIFFERENTIAL/PLATELET
Abs Immature Granulocytes: 0.01 10*3/uL (ref 0.00–0.07)
Basophils Absolute: 0 10*3/uL (ref 0.0–0.1)
Basophils Relative: 0 %
Eosinophils Absolute: 0 10*3/uL (ref 0.0–0.5)
Eosinophils Relative: 1 %
HCT: 40.5 % (ref 39.0–52.0)
Hemoglobin: 13.3 g/dL (ref 13.0–17.0)
Immature Granulocytes: 0 %
Lymphocytes Relative: 28 %
Lymphs Abs: 1.2 10*3/uL (ref 0.7–4.0)
MCH: 31.4 pg (ref 26.0–34.0)
MCHC: 32.8 g/dL (ref 30.0–36.0)
MCV: 95.7 fL (ref 80.0–100.0)
Monocytes Absolute: 0.1 10*3/uL (ref 0.1–1.0)
Monocytes Relative: 2 %
Neutro Abs: 3.1 10*3/uL (ref 1.7–7.7)
Neutrophils Relative %: 69 %
Platelets: 187 10*3/uL (ref 150–400)
RBC: 4.23 MIL/uL (ref 4.22–5.81)
RDW: 14.6 % (ref 11.5–15.5)
WBC: 4.4 10*3/uL (ref 4.0–10.5)
nRBC: 0 % (ref 0.0–0.2)

## 2020-10-29 LAB — COMPREHENSIVE METABOLIC PANEL
ALT: 15 U/L (ref 0–44)
AST: 41 U/L (ref 15–41)
Albumin: 3.7 g/dL (ref 3.5–5.0)
Alkaline Phosphatase: 35 U/L — ABNORMAL LOW (ref 38–126)
Anion gap: 14 (ref 5–15)
BUN: 21 mg/dL (ref 8–23)
CO2: 25 mmol/L (ref 22–32)
Calcium: 9.2 mg/dL (ref 8.9–10.3)
Chloride: 100 mmol/L (ref 98–111)
Creatinine, Ser: 1 mg/dL (ref 0.61–1.24)
GFR, Estimated: >60 mL/min (ref >60)
Glucose, Bld: 279 mg/dL — ABNORMAL HIGH (ref 70–99)
Potassium: 4.8 mmol/L (ref 3.5–5.1)
Sodium: 139 mmol/L (ref 135–145)
Total Bilirubin: 1.9 mg/dL — ABNORMAL HIGH (ref 0.3–1.2)
Total Protein: 6.6 g/dL (ref 6.5–8.1)

## 2020-10-29 LAB — PROTIME-INR
INR: 0.9 (ref 0.8–1.2)
Prothrombin Time: 12.5 seconds (ref 11.4–15.2)

## 2020-10-29 LAB — CBG MONITORING, ED: Glucose-Capillary: 287 mg/dL — ABNORMAL HIGH (ref 70–99)

## 2020-10-29 MED ORDER — DIPHENHYDRAMINE HCL 50 MG/ML IJ SOLN
25.0000 mg | Freq: Once | INTRAMUSCULAR | Status: DC
  Filled 2020-10-29: qty 1

## 2020-10-29 MED ORDER — PREDNISONE 20 MG PO TABS
ORAL_TABLET | ORAL | 0 refills | Status: AC
Start: 2020-10-29 — End: ?

## 2020-10-29 MED ORDER — EPINEPHRINE PF 1 MG/ML IJ SOLN
INTRAMUSCULAR | Status: AC
  Filled 2020-10-29: qty 1

## 2020-10-29 MED ORDER — METHYLPREDNISOLONE SODIUM SUCC 125 MG IJ SOLR
125.0000 mg | Freq: Once | INTRAMUSCULAR | Status: AC
  Administered 2020-10-29: 125 mg via INTRAVENOUS
  Filled 2020-10-29: qty 2

## 2020-10-29 MED ORDER — EPINEPHRINE 0.3 MG/0.3ML IJ SOAJ
INTRAMUSCULAR | Status: AC
  Administered 2020-10-29: 0.3 mg
  Filled 2020-10-29: qty 0.3

## 2020-10-29 MED ORDER — FAMOTIDINE 20 MG PO TABS
20.0000 mg | ORAL_TABLET | Freq: Two times a day (BID) | ORAL | 0 refills | Status: AC
Start: 2020-10-29 — End: ?

## 2020-10-29 MED ORDER — SODIUM CHLORIDE 0.9 % IV BOLUS
1000.0000 mL | Freq: Once | INTRAVENOUS | Status: AC
  Administered 2020-10-29: 1000 mL via INTRAVENOUS

## 2020-10-29 MED ORDER — FAMOTIDINE IN NACL 20-0.9 MG/50ML-% IV SOLN
20.0000 mg | Freq: Once | INTRAVENOUS | Status: AC
  Administered 2020-10-29: 20 mg via INTRAVENOUS
  Filled 2020-10-29: qty 50

## 2020-10-29 MED ORDER — DIPHENHYDRAMINE HCL 25 MG PO TABS
25.0000 mg | ORAL_TABLET | Freq: Four times a day (QID) | ORAL | 0 refills | Status: AC | PRN
Start: 2020-10-29 — End: ?

## 2020-10-29 MED ORDER — EPINEPHRINE 0.3 MG/0.3ML IJ SOAJ: 0.3000 mg | INTRAMUSCULAR | 1 refills | Status: AC | PRN

## 2020-10-29 NOTE — ED Notes (Signed)
Pt given urinal.

## 2020-10-29 NOTE — ED Notes (Signed)
Provider back at the bedside to re-evaluate. Per Provider's verbal order, pt to receive 137mL/hr IV LR's.

## 2020-10-29 NOTE — Discharge Instructions (Addendum)
1.  You had a serious allergic reaction called anaphylaxis.  You must carry your epinephrine pens with you at all times.  Avoid any contact with bees wasps or other stinging insects. 2.  You have been prescribed prednisone.  Start your first dose this evening when you get home.  Then take daily as prescribed.  Take Benadryl 25 to 50mg  every 6 hours for any red itching or swelling rash.  Take Pepcid 20 mg twice daily for the next week. 3.  Return to the emergency department immediately by EMS if you feel that you are getting any recurrence of difficulty swallowing, breathing, general weakness, general body rash or any other concerning symptoms.

## 2020-10-29 NOTE — ED Notes (Signed)
ED provider back at bedside to re-evaluate.

## 2020-10-29 NOTE — ED Notes (Signed)
Provider notified of pt's rectal temp of 94.30F rectally. Beasr hugger applied to pt at this time as well as warm blankets. No additional orders from provider at this time. Will continue to monitor.

## 2020-10-29 NOTE — ED Notes (Signed)
Provider at bedside and notified of pt's condition.

## 2020-10-29 NOTE — ED Notes (Signed)
Manual BP obtained per provider's verbal order is 110/69, obtained from left arm while pt lying flat and resting with eyes closed. Provider notified and aware.

## 2020-10-29 NOTE — ED Notes (Signed)
Oral temp obtained at 93.54F. Will re-attempt with rectal temp.

## 2020-10-29 NOTE — ED Notes (Signed)
Unable to obtain oral temp at this time x2 attempts. Will re-attempt. Provider aware.

## 2020-10-29 NOTE — ED Notes (Signed)
Epi 0.3mg  administered intramuscularly to right quadriceps with ED provider, ED RN Lind Covert, and ED PA-C Eustace Quail at bedside. Pt attached to cardiac monitor x3.

## 2020-10-29 NOTE — ED Triage Notes (Signed)
Pt presents to the ED s/p bee sting to the left temple which occurred just prior to arrival. Per EMS, the pt 's initial BP on scene was 60/48. BP improved to 90/48 en route with a 150cc NS bolus. Pt does not have a known bee sting allergy. Pt is uncertain what type of bee stung him and states he received Tylenol and 50mg  PO Benadryl prior to EMS arrival. 50mg  PO Benadryl was given en route by EMS. Pt arrives to ED with O2 sats at 88% to 90% on room air. Pt with diffuse erythematous rash, bilateral foot and hand swelling, c/o mild SOB, and "itching." Pt reports a known health hx of DM, gout and glaucoma and reports his only known allergy is Aspirin.

## 2020-10-29 NOTE — ED Notes (Signed)
Pt's initial O2 on room air was 88% to 90%. Nasal Cannula O2 applied at 2L with O2 sats improvement to 100%.

## 2020-10-29 NOTE — ED Notes (Signed)
Per Provider's verbal order, LR now bolus dose.

## 2020-10-29 NOTE — ED Notes (Signed)
171mL/hr LR started to right hand 20g peripheral IV.

## 2020-10-29 NOTE — ED Provider Notes (Signed)
[facility]-EMERGENCY DEPT Provider Note   CSN: [id number] Arrival date & time: 10/29/20  1006     History Chief Complaint  Patient presents with   Insect Bite    Brett Atkins is a 75 y.o. male.  HPI Patient has no prior history to be allergy.  He was out sweeping his porch and got a sudden painful sting to the left temple next to his eye.  He went in the house and his wife gave him a Benadryl tablet.  He was having some swelling by the eye and itching.  He reports shortly thereafter he started to feel very poorly.  He called for his wife.  He felt like he was generally weak and has some difficulty standing up, he started to feel a cramping in his abdomen like he needed to have a bowel movement.  EMS was called.  Upon fire arrival he reportedly had blood pressure of 60/40.  EMS reports upon their arrival, patient's blood pressure was 90/60.  On arrival patient has a full body erythematous rash.  EMS reports that that just started after their arrival to the emergency department.  Patient reports that after he got the sting he did start to feel itching that came first at the site of the sting and then his whole body started itching including his hands feeling tingly.  Patient did not have a syncopal episode but felt too weak to stand after the episode.  He denies chest pain.  He felt short of breath but did not have feeling of his throat closing.  Patient has mild asthma history.  Asthmatic as a child with one exacerbation in his 78s.  Not chronically asthmatic.  No history of coronary artery disease.    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   Past Surgical History:  Procedure Laterality Date   GUM SURGERY     RETINAL TEAR REPAIR CRYOTHERAPY Left    TRIGGER FINGER RELEASE Right 09/06/2012   Procedure: RELEASE TRIGGER FINGER/A-1 PULLEY RIGHT MIDDLE, RING AND SMALL FINGER;  Surgeon: Nicki Reaper, MD;  Location: [facility];  Service: Orthopedics;  Laterality: Right;   TRIGGER FINGER RELEASE Left 03/30/2018   Procedure: RELEASE TRIGGER FINGER/A-1 PULLEY LEFT INDEX AND LEFT MIDDLE FINGER;  Surgeon: Cindee Salt, MD;  Location: [facility];  Service: Orthopedics;  Laterality: Left;       History reviewed. No pertinent family history.  Social History   Tobacco Use   Smoking status: Never   Smokeless tobacco: Never  Vaping Use   Vaping Use: Never used  Substance Use Topics   Alcohol use: Yes    Comment: daily wine with dinner - 2 glasses   Drug use: No    Home Medications Prior to Admission medications   Medication Sig Start Date End Date Taking? Authorizing Provider  acetaminophen (TYLENOL)  500 MG tablet Take 500 mg by mouth every 6 (six) hours as needed for mild pain.   Yes [provider]  allopurinol (ZYLOPRIM) 300 MG tablet Take 300 mg by mouth daily.   Yes [provider]  diphenhydrAMINE (BENADRYL) 25 MG tablet Take 25 mg by mouth once as needed (for allergic reactions).   Yes [provider]  diphenhydrAMINE (BENADRYL) 25 MG tablet Take 1 tablet (25 mg total) by mouth every 6 (six) hours as needed. 10/29/20  Yes Arby Barrette, MD  EPINEPHrine (EPIPEN 2-PAK) 0.3 mg/0.3 mL IJ SOAJ injection Inject 0.3 mg into the muscle as needed for up to 4 doses for anaphylaxis. 10/29/20  Yes Arby Barrette, MD  famotidine (PEPCID) 10 MG tablet Take 10 mg by mouth 2 (two) times daily.   Yes [provider]   famotidine (PEPCID) 20 MG tablet Take 1 tablet (20 mg total) by mouth 2 (two) times daily. 10/29/20  Yes Arby Barrette, MD  fluticasone (FLONASE) 50 MCG/ACT nasal spray Place 1 spray into both nostrils in the morning.   Yes [provider]  glimepiride (AMARYL) 4 MG tablet Take 4 mg by mouth daily before breakfast.   Yes [provider]  latanoprost (XALATAN) 0.005 % ophthalmic solution Place 1 drop into both eyes at bedtime.   Yes [provider]  losartan (COZAAR) 25 MG tablet Take 25 mg by mouth daily.   Yes [provider]  metFORMIN (GLUCOPHAGE-XR) 500 MG 24 hr tablet Take 500 mg by mouth 2 (two) times daily. 10/03/20  Yes [provider]  pioglitazone (ACTOS) 45 MG tablet Take 45 mg by mouth in the morning.   Yes [provider]  predniSONE (DELTASONE) 20 MG tablet 3 tabs po day one, then 2 po daily x 4 days 10/29/20  Yes Jassen Sarver, Lebron Conners, MD  PROAIR HFA [phone] Base) MCG/ACT inhaler Inhale 2 puffs into the lungs every 6 (six) hours as needed for wheezing or shortness of breath.   Yes [provider]  PULMICORT FLEXHALER 180 MCG/ACT inhaler Inhale 2 puffs into the lungs in the morning and at bedtime.   Yes [provider]  sildenafil (VIAGRA) 50 MG tablet Take 50 mg by mouth daily as needed for erectile dysfunction.   Yes [provider]  simvastatin (ZOCOR) 40 MG tablet Take 40 mg by mouth daily.    Yes [provider]  timolol (BETIMOL) 0.5 % ophthalmic solution Place 1 drop into both eyes at bedtime.    Yes [provider]  albuterol (PROVENTIL) (2.5 MG/3ML) 0.083% nebulizer solution Take 2.5 mg by nebulization every 6 (six) hours as needed for wheezing or shortness of breath.    [provider]  traMADol (ULTRAM) 50 MG tablet Take 1 tablet (50 mg total) by mouth every 6 (six) hours as needed. Patient not taking: No sig reported 03/30/18   Cindee Salt, MD    Allergies    Aspirin and  Bee venom  Review of Systems   Review of Systems 10 systems reviewed and negative except per HPI Physical Exam Updated Vital Signs BP 107/66   Pulse 63   Temp (!) 97.5 F (36.4 C) (Oral)   Resp 15   Ht 5\' 8"  (1.727 m)   Wt 79.8 kg   SpO2 97%   BMI 26.76 kg/m   Physical Exam Constitutional:      Comments: Patient is alert.  No respiratory distress.  Mental status clear.  He has diffuse erythematous appearance  of entire body  HENT:     Head: Normocephalic and atraumatic.     Mouth/Throat:     Mouth: Mucous membranes are moist.     Pharynx: Oropharynx is clear.     Comments: Posterior airway clear.  No tongue swelling no stridor Eyes:     Extraocular Movements: Extraocular movements intact.     Comments: Patient has a erythematous sting site at the lateral canthus of the left eye.  There is some injection of the left eye with mild scattered subconjunctival hematoma.  At this time, he does not have large periorbital focal swelling.  Ocular motions are intact.  Abdominal:     General: There is no distension.     Palpations: Abdomen is soft.     Tenderness: There is no abdominal tenderness. There is no guarding.  Musculoskeletal:        General: Normal range of motion.     Comments: Patient has mild edema of the hands and the feet.  Skin:    Comments: Patient has diffuse erythematous rash of the entirety of the body.  A little bit of sparing with urticarial appearance on the pretibial surfaces.  Neurological:     Comments: Mental status clear.  No focal motor deficits.  Psychiatric:     Comments: Alert and cooperative.     ED Results / Procedures / Treatments   Labs (all labs ordered are listed, but only abnormal results are displayed) Labs Reviewed  COMPREHENSIVE METABOLIC PANEL - Abnormal; Notable for the following components:      Result Value   Glucose, Bld 279 (*)    Alkaline Phosphatase 35 (*)    Total Bilirubin 1.9 (*)    All other components within normal  limits  CBG MONITORING, ED - Abnormal; Notable for the following components:   Glucose-Capillary 287 (*)    All other components within normal limits  CBC WITH DIFFERENTIAL/PLATELET  PROTIME-INR    EKG EKG Interpretation  Date/Time:  Wednesday October 29 2020 10:32:23 EDT Ventricular Rate:  64 PR Interval:    QRS Duration: 100 QT Interval:  446 QTC Calculation: 461 R Axis:   65 Text Interpretation: Sinus rhythm inferior leads no change. subtle anterior t wave flattening compared to previous Confirmed by Arby Barrette [phone]) on 10/29/2020 12:48:32 PM  Radiology No results found.  Procedures Procedures  CRITICAL CARE Performed by: Arby Barrette   Total critical care time: 45 minutes  Critical care time was exclusive of separately billable procedures and treating other patients.  Critical care was necessary to treat or prevent imminent or life-threatening deterioration.  Critical care was time spent personally by me on the following activities: development of treatment plan with patient and/or surrogate as well as nursing, discussions with consultants, evaluation of patient's response to treatment, examination of patient, obtaining history from patient or surrogate, ordering and performing treatments and interventions, ordering and review of laboratory studies, ordering and review of radiographic studies, pulse oximetry and re-evaluation of patient's condition.  Medications Ordered in ED Medications  diphenhydrAMINE (BENADRYL) injection 25 mg (25 mg Intravenous Not Given 10/29/20 1101)  EPINEPHrine (EPI-PEN) 0.3 mg/0.3 mL injection (0.3 mg  Given 10/29/20 1020)  methylPREDNISolone sodium succinate (SOLU-MEDROL) 125 mg/2 mL injection 125 mg (125 mg Intravenous Given 10/29/20 1030)  famotidine (PEPCID) IVPB 20 mg premix (20 mg Intravenous New Bag/Given 10/29/20 1101)  sodium chloride 0.9 % bolus 1,000 mL (1,000 mLs Intravenous New Bag/Given 10/29/20 1100)    ED Course  I have  reviewed the triage vital signs and the nursing notes.  Pertinent labs & imaging results that were available during my care of the patient were reviewed by me and considered in my medical decision making (see chart for details).  Clinical Course as of 10/29/20 1538  Wed Oct 29, 2020  1206 Patient resting without distress.  No respiratory distress.  No perception of difficulty swallowing.  Itching has resolved.  Patient continues to have some erythematous rash but significantly improved on face and extremities. [MP]    Clinical Course User Index [MP] Arby BarrettePfeiffer, Fabian Walder, MD   MDM Rules/Calculators/A&P                           Patient presents after bee sting.  He did not have previous knowledge of allergic reaction to stings.  He developed hypotension with near syncope and whole body urticarial rash.  Patient was treated with epinephrine pen, Solu-Medrol and Pepcid.  He had been given a total of Benadryl 100 mg orally prior to arrival.  Resuscitated with 2 L fluid resuscitation.  Ultimately rash has resolved.  Patient has now been up and ambulatory without hypotension or syncope.  No recrudescence of urticaria.  Patient does not perceive any throat tightness or difficulty breathing.  Patient counseled on keeping epinephrine pen with him at all times.  We will have him take an additional 5 doses of prednisone and close follow-up and return precautions reviewed.  He does have a ecchymotic bruising at the site of his eye where he was stung.  Also some conjunctival hemorrhage.  No large periorbital swelling. Final Clinical Impression(s) / ED Diagnoses Final diagnoses:  Bee sting reaction, accidental or unintentional, initial encounter  Anaphylaxis, initial encounter    Rx / DC Orders ED Discharge Orders          Ordered    EPINEPHrine (EPIPEN 2-PAK) 0.3 mg/0.3 mL IJ SOAJ injection  As needed        10/29/20 1534    predniSONE (DELTASONE) 20 MG tablet        10/29/20 1534    diphenhydrAMINE  (BENADRYL) 25 MG tablet  Every 6 hours PRN        10/29/20 1534    famotidine (PEPCID) 20 MG tablet  2 times daily        10/29/20 1534             Arby BarrettePfeiffer, Chrisa Hassan, MD 10/29/20 1541

## 2020-10-29 NOTE — ED Notes (Signed)
Provider at the bedside.  

## 2020-10-29 NOTE — ED Notes (Signed)
Provider notified pt tolerated sitting upright on the bedside commode without difficulty. Pt tolerating PO intake. VS remain stable at this time.
